# Patient Record
Sex: Male | Born: 1995 | Race: White | Hispanic: No | Marital: Single | Smoking: Current every day smoker
Health system: Southern US, Community
[De-identification: ages and names within clinical notes are randomized; demographics above are authoritative.]

## PROBLEM LIST (undated history)

## (undated) DIAGNOSIS — J45909 Unspecified asthma, uncomplicated: Secondary | ICD-10-CM

## (undated) DIAGNOSIS — A6002 Herpesviral infection of other male genital organs: Secondary | ICD-10-CM

## (undated) HISTORY — PX: FRACTURE SURGERY: SHX138

## (undated) HISTORY — PX: APPENDECTOMY: SHX54

---
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
## 2013-03-02 ENCOUNTER — Emergency Department (HOSPITAL_COMMUNITY)
Admission: EM | Admit: 2013-03-02 | Discharge: 2013-03-02 | Disposition: A | Discharge status: Home / Self Care | Payer: Medicaid Other | Attending: Emergency Medicine | Admitting: Emergency Medicine

## 2013-03-02 ENCOUNTER — Encounter (HOSPITAL_COMMUNITY): Payer: Self-pay | Admitting: *Deleted

## 2013-03-02 DIAGNOSIS — R05 Cough: Secondary | ICD-10-CM

## 2013-03-02 DIAGNOSIS — F17200 Nicotine dependence, unspecified, uncomplicated: Secondary | ICD-10-CM | POA: Insufficient documentation

## 2013-03-02 DIAGNOSIS — R059 Cough, unspecified: Secondary | ICD-10-CM | POA: Insufficient documentation

## 2013-03-02 DIAGNOSIS — R0602 Shortness of breath: Secondary | ICD-10-CM | POA: Insufficient documentation

## 2013-03-02 DIAGNOSIS — R079 Chest pain, unspecified: Secondary | ICD-10-CM | POA: Insufficient documentation

## 2013-03-02 DIAGNOSIS — J029 Acute pharyngitis, unspecified: Secondary | ICD-10-CM | POA: Insufficient documentation

## 2013-03-02 DIAGNOSIS — J3489 Other specified disorders of nose and nasal sinuses: Secondary | ICD-10-CM | POA: Insufficient documentation

## 2013-03-02 DIAGNOSIS — IMO0001 Reserved for inherently not codable concepts without codable children: Secondary | ICD-10-CM | POA: Insufficient documentation

## 2013-03-02 MED ORDER — ALBUTEROL SULFATE HFA 108 (90 BASE) MCG/ACT IN AERS: 2.0000 | INHALATION_SPRAY | Freq: Once | RESPIRATORY_TRACT | Status: DC

## 2013-03-02 MED ORDER — DOXYCYCLINE HYCLATE 100 MG PO CAPS: 100.0000 mg | ORAL_CAPSULE | Freq: Two times a day (BID) | ORAL | Status: DC

## 2013-03-02 NOTE — ED Provider Notes (Signed)
History  This chart was scribed for Joya Gaskins, MD by Bennett Scrape, ED Scribe. This patient was seen in room [facility] and the patient's care was started at 9:31 AM.  CSN: [id number]  Arrival date & time 03/02/13  0927   First MD Initiated Contact with Patient 03/02/13 [phone]      Chief Complaint  Patient presents with  . Cough     Patient is a 17 y.o. male presenting with cough. The history is provided by the patient. No language interpreter was used.  Cough Cough characteristics:  Productive Onset quality:  Gradual Duration:  2 days Timing:  Constant Progression:  Worsening Chronicity:  New Smoker: yes   Context: upper respiratory infection   Relieved by:  Nothing Worsened by:  Nothing tried Ineffective treatments:  None tried Associated symptoms: chest pain, myalgias, shortness of breath, sinus congestion and sore throat   Associated symptoms: no fever     HPI Comments: Donald Marshall is a 17 y.o. male who presents to the Emergency Department complaining of 2 days of gradual onset, gradually worsening, constant productive cough with associated sore throat, nasal congestion, SOB and rib pain described as soreness from coughing. This morning he had one episode of hemoptysis described as a hand full of bloody sputum. He denies any further episodes of hemoptysis  He denies similarities with prior illnesses. He denies epistaxis, fever, abdominal pain and leg swelling as associated symptoms. He denies having a h/o cardiac or pulmonary conditions. He denies any prior diagnoses of PE or DVT. Pt does not have a h/o chronic medical conditions.  PMH - none  Past Surgical History  Procedure Laterality Date  . Appendectomy      No family history on file.  History  Substance Use Topics  . Smoking status: Current Every Day Smoker -- 0.50 packs/day    Types: Cigarettes  . Smokeless tobacco: Not on file  . Alcohol Use: No      Review of Systems  Constitutional:  Negative for fever.  HENT: Positive for sore throat.   Respiratory: Positive for cough and shortness of breath.   Cardiovascular: Positive for chest pain. Negative for palpitations.  Musculoskeletal: Positive for myalgias.    Allergies  Sulfonamide derivatives  Home Medications   Current Outpatient Rx  Name  Route  Sig  Dispense  Refill  . ibuprofen (ADVIL,MOTRIN) 200 MG tablet   Oral   Take 400 mg by mouth every 6 (six) hours as needed. Pain           Triage Vitals: BP 119/71  Pulse 104  Temp(Src) 97.6 F (36.4 C) (Oral)  Resp 20  SpO2 100%  Physical Exam  Nursing note and vitals reviewed.  CONSTITUTIONAL: Well developed/well nourished HEAD: Normocephalic/atraumatic EYES: EOMI/PERRL ENMT: Mucous membranes moist, nasal congestion No blood noted in mouth or nares NECK: supple no meningeal signs SPINE:entire spine nontender CV: S1/S2 noted, no murmurs/rubs/gallops noted LUNGS: scattered wheezes, no apparent distress. He has congested cough ABDOMEN: soft, nontender, no rebound or guarding GU:no cva tenderness NEURO: Pt is awake/alert, moves all extremitiesx4 EXTREMITIES: pulses normal, full ROM, no calf tenderness SKIN: warm, color normal PSYCH: no abnormalities of mood noted  ED Course  Procedures   DIAGNOSTIC STUDIES: Oxygen Saturation is 100% on room air, normal by my interpretation.    COORDINATION OF CARE: 9:41 AM-Observed pt ambulate around the ED. No hypoxia was noted. Advised pt that he needs to stop smoking. Discussed treatment plan which includes inhaler and antibiotic  with pt at bedside and pt agreed to plan.   I personally ambulated patient in the ED.  He had mild tachypnea, but no distress, and no hypoxia (pulse ox 100%) Given small amt of hemoptysis I did order antibiotic Given he has cough/congestion and he is a smoker, suspect possible bronchitis.  Doubt PE or other acute cardiopulmonary process.  He left before medications given.  I told him to  stop smoking   MDM  Nursing notes including past medical history and social history reviewed and considered in documentation    I personally performed the services described in this documentation, which was scribed in my presence. The recorded information has been reviewed and is accurate.          Joya Gaskins, MD 03/02/13 [phone]

## 2013-03-02 NOTE — ED Notes (Signed)
Patient left refusing medication and prescription, did not sign.

## 2013-03-02 NOTE — ED Notes (Signed)
Pt c/o coughing that started two days ago, this am when pt coughed he noticed that the sputum was mixed with dark blood,

## 2013-03-11 ENCOUNTER — Emergency Department (HOSPITAL_COMMUNITY): Payer: Medicaid Other

## 2013-03-11 ENCOUNTER — Emergency Department (HOSPITAL_COMMUNITY)
Admission: EM | Admit: 2013-03-11 | Discharge: 2013-03-11 | Disposition: A | Discharge status: Home / Self Care | Payer: Medicaid Other | Attending: Emergency Medicine | Admitting: Emergency Medicine

## 2013-03-11 ENCOUNTER — Encounter (HOSPITAL_COMMUNITY): Payer: Self-pay | Admitting: Emergency Medicine

## 2013-03-11 DIAGNOSIS — J029 Acute pharyngitis, unspecified: Secondary | ICD-10-CM | POA: Insufficient documentation

## 2013-03-11 DIAGNOSIS — J209 Acute bronchitis, unspecified: Secondary | ICD-10-CM

## 2013-03-11 DIAGNOSIS — J3489 Other specified disorders of nose and nasal sinuses: Secondary | ICD-10-CM | POA: Insufficient documentation

## 2013-03-11 DIAGNOSIS — J40 Bronchitis, not specified as acute or chronic: Secondary | ICD-10-CM | POA: Insufficient documentation

## 2013-03-11 DIAGNOSIS — IMO0002 Reserved for concepts with insufficient information to code with codable children: Secondary | ICD-10-CM | POA: Insufficient documentation

## 2013-03-11 DIAGNOSIS — F17200 Nicotine dependence, unspecified, uncomplicated: Secondary | ICD-10-CM | POA: Insufficient documentation

## 2013-03-11 MED ORDER — BENZONATATE 100 MG PO CAPS: 100.0000 mg | ORAL_CAPSULE | Freq: Three times a day (TID) | ORAL | Status: DC | PRN

## 2013-03-11 MED ORDER — ALBUTEROL SULFATE HFA 108 (90 BASE) MCG/ACT IN AERS
2.0000 | INHALATION_SPRAY | Freq: Once | RESPIRATORY_TRACT | Status: AC
  Administered 2013-03-11: 2 via RESPIRATORY_TRACT
  Filled 2013-03-11: qty 6.7

## 2013-03-11 MED ORDER — BENZONATATE 100 MG PO CAPS
200.0000 mg | ORAL_CAPSULE | Freq: Once | ORAL | Status: AC
  Administered 2013-03-11: 200 mg via ORAL
  Filled 2013-03-11: qty 2

## 2013-03-11 MED ORDER — PREDNISONE 50 MG PO TABS
60.0000 mg | ORAL_TABLET | Freq: Once | ORAL | Status: AC
  Administered 2013-03-11: 60 mg via ORAL
  Filled 2013-03-11: qty 1

## 2013-03-11 MED ORDER — PREDNISONE 10 MG PO TABS: ORAL_TABLET | ORAL | Status: DC

## 2013-03-11 NOTE — ED Notes (Signed)
Pt c/o chest congestion and cough x 2 weeks.

## 2013-03-12 NOTE — ED Provider Notes (Signed)
Medical screening examination/treatment/procedure(s) were performed by non-physician practitioner and as supervising physician I was immediately available for consultation/collaboration.  Fremon Zacharia R. Mashawn Brazil, MD 03/12/13 2325 

## 2013-03-12 NOTE — ED Provider Notes (Signed)
History     CSN: [id number]  Arrival date & time 03/11/13  2008   First MD Initiated Contact with Patient 03/11/13 2048      Chief Complaint  Patient presents with  . Cough  . Nasal Congestion    (Consider location/radiation/quality/duration/timing/severity/associated sxs/prior treatment) HPI Comments: Donald Marshall is a 17 y.o. Male presenting with a 2 week history of chest congestion,  Describing frequent coughing which is productive of white to yellow sputum along with wheezing which is worsened when he first wakes in the morning,  States he has frequent productive coughing "til about noon"  Making it hard for him to do his job.  He has had no fevers or chills but does report increased shortness of breath mostly during the morning hours.  He does have occasional wheezing.  When his symptoms started he had nasal congestion and sore throat which has improved.  He describes bilateral chest soreness from coughing.  He denies hemoptysis this week but was seen here one week ago for the same symptoms at which time he had experienced one episode of this.  He denies history of cardiac or lung problems,  Denies history of asthma.  He is a 3/4 ppd smoker.  He has tried mucinex without relief.     The history is provided by the patient.    History reviewed. No pertinent past medical history.  Past Surgical History  Procedure Laterality Date  . Appendectomy      History reviewed. No pertinent family history.  History  Substance Use Topics  . Smoking status: Current Every Day Smoker -- 0.50 packs/day    Types: Cigarettes  . Smokeless tobacco: Not on file  . Alcohol Use: No      Review of Systems  Constitutional: Negative for fever.  HENT: Negative for congestion, sore throat and neck pain.   Eyes: Negative.   Respiratory: Negative for chest tightness and shortness of breath.   Cardiovascular: Negative for chest pain.  Gastrointestinal: Negative for nausea and abdominal pain.   Genitourinary: Negative.   Musculoskeletal: Negative for joint swelling and arthralgias.  Skin: Negative.  Negative for rash and wound.  Neurological: Negative for dizziness, weakness, light-headedness, numbness and headaches.  Psychiatric/Behavioral: Negative.     Allergies  Sulfonamide derivatives  Home Medications   Current Outpatient Rx  Name  Route  Sig  Dispense  Refill  . ibuprofen (ADVIL,MOTRIN) 200 MG tablet   Oral   Take 400 mg by mouth every 6 (six) hours as needed. Pain         . benzonatate (TESSALON) 100 MG capsule   Oral   Take 1 capsule (100 mg total) by mouth 3 (three) times daily as needed for cough.   21 capsule   0   . predniSONE (DELTASONE) 10 MG tablet      6, 5, 4, 3, 2 then 1 tablet by mouth daily for 6 days total.   21 tablet   0     BP 119/70  Pulse 95  Temp(Src) 98.4 F (36.9 C) (Oral)  Resp 20  Ht 5\' 9"  (1.753 m)  Wt 161 lb 1 oz (73.057 kg)  BMI 23.77 kg/m2  SpO2 98%  Physical Exam  Nursing note and vitals reviewed. Constitutional: He appears well-developed and well-nourished.  HENT:  Head: Normocephalic and atraumatic.  Right Ear: Tympanic membrane normal.  Left Ear: Tympanic membrane normal.  Nose: No mucosal edema or rhinorrhea.  Mouth/Throat: Uvula is midline, oropharynx is clear and  moist and mucous membranes are normal.  Eyes: Conjunctivae are normal.  Neck: Normal range of motion.  Cardiovascular: Normal rate, regular rhythm, normal heart sounds and intact distal pulses.   Pulmonary/Chest: Effort normal. No respiratory distress. He has wheezes in the left middle field and the left lower field. He has no rales.  Coarse breath sounds throughout all lung fields.  Abdominal: Soft. Bowel sounds are normal. He exhibits no distension. There is no tenderness.  Musculoskeletal: Normal range of motion. He exhibits no edema and no tenderness.  Neurological: He is alert.  Skin: Skin is warm and dry.  Psychiatric: He has a normal  mood and affect.    ED Course  Procedures (including critical care time)  Labs Reviewed - No data to display Dg Chest 2 View  03/11/2013   *RADIOLOGY REPORT*  Clinical Data: Cough and congestion.  CHEST - 2 VIEW  Comparison: Chest x-ray 06/16/2008.  Findings: Lung volumes are normal.  No consolidative airspace disease.  No pleural effusions.  No pneumothorax.  No pulmonary nodule or mass noted.  Pulmonary vasculature and the cardiomediastinal silhouette are within normal limits.  IMPRESSION: 1. No radiographic evidence of acute cardiopulmonary disease.   Original Report Authenticated By: Trudie Reed, M.D.     1. Bronchitis with bronchospasm       MDM  Patients labs and/or radiological studies were viewed and considered during the medical decision making and disposition process.  Pt was given albuterol mdi 2 puffs in ed, tessalon perles and prednisone 60 mg po.  Wheezing resolved at time of dc,  Cough improved per pt.  He was prescribed tessalon, prednisone taper.  Given albuterol mdi - encouraged 2 puffs q 4 hours prn cough or wheeze.  Discussed his need to stop smoking.  Pt understands.         Burgess Amor, PA-C 03/12/13 1402

## 2013-03-21 ENCOUNTER — Emergency Department (HOSPITAL_COMMUNITY): Payer: Medicaid Other

## 2013-03-21 ENCOUNTER — Emergency Department (HOSPITAL_COMMUNITY)
Admission: EM | Admit: 2013-03-21 | Discharge: 2013-03-21 | Disposition: A | Discharge status: Home / Self Care | Payer: Medicaid Other | Attending: Emergency Medicine | Admitting: Emergency Medicine

## 2013-03-21 ENCOUNTER — Encounter (HOSPITAL_COMMUNITY): Payer: Self-pay | Admitting: *Deleted

## 2013-03-21 ENCOUNTER — Emergency Department (HOSPITAL_COMMUNITY)
Admission: EM | Admit: 2013-03-21 | Discharge: 2013-03-21 | Disposition: A | Discharge status: Home / Self Care | Payer: No Typology Code available for payment source

## 2013-03-21 DIAGNOSIS — Y92410 Unspecified street and highway as the place of occurrence of the external cause: Secondary | ICD-10-CM | POA: Insufficient documentation

## 2013-03-21 DIAGNOSIS — S60212A Contusion of left wrist, initial encounter: Secondary | ICD-10-CM

## 2013-03-21 DIAGNOSIS — S8001XA Contusion of right knee, initial encounter: Secondary | ICD-10-CM

## 2013-03-21 DIAGNOSIS — S61511A Laceration without foreign body of right wrist, initial encounter: Secondary | ICD-10-CM

## 2013-03-21 DIAGNOSIS — S60219A Contusion of unspecified wrist, initial encounter: Secondary | ICD-10-CM | POA: Insufficient documentation

## 2013-03-21 DIAGNOSIS — Y9389 Activity, other specified: Secondary | ICD-10-CM | POA: Insufficient documentation

## 2013-03-21 DIAGNOSIS — F17200 Nicotine dependence, unspecified, uncomplicated: Secondary | ICD-10-CM | POA: Insufficient documentation

## 2013-03-21 DIAGNOSIS — S61509A Unspecified open wound of unspecified wrist, initial encounter: Secondary | ICD-10-CM | POA: Insufficient documentation

## 2013-03-21 DIAGNOSIS — S8000XA Contusion of unspecified knee, initial encounter: Secondary | ICD-10-CM | POA: Insufficient documentation

## 2013-03-21 MED ORDER — OXYCODONE-ACETAMINOPHEN 5-325 MG PO TABS: 1.0000 | ORAL_TABLET | ORAL | Status: DC | PRN

## 2013-03-21 MED ORDER — LIDOCAINE HCL (PF) 1 % IJ SOLN
10.0000 mL | Freq: Once | INTRAMUSCULAR | Status: AC
  Administered 2013-03-21: 10 mL via INTRADERMAL
  Filled 2013-03-21: qty 5

## 2013-03-21 MED ORDER — OXYCODONE-ACETAMINOPHEN 5-325 MG PO TABS
1.0000 | ORAL_TABLET | Freq: Once | ORAL | Status: AC
Start: 2013-03-21 — End: 2013-03-21
  Administered 2013-03-21: 1 via ORAL
  Filled 2013-03-21: qty 1

## 2013-03-21 MED ORDER — DOXYCYCLINE HYCLATE 100 MG PO CAPS: 100.0000 mg | ORAL_CAPSULE | Freq: Two times a day (BID) | ORAL | Status: DC

## 2013-03-21 MED ORDER — BACITRACIN ZINC 500 UNIT/GM EX OINT
1.0000 "application " | TOPICAL_OINTMENT | Freq: Two times a day (BID) | CUTANEOUS | Status: DC
  Administered 2013-03-21: 1 via TOPICAL
  Filled 2013-03-21: qty 2.7
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
## 2013-03-21 NOTE — ED Notes (Addendum)
After 2 more argumentative phone calls, pt and I reached an agreement that no further phone calls/visitors until discharge so that we may attend to his medical needs. Pt and friend Jeralyn Bennett agrees. Donald Marshall to handle waiting room entourage. Registration notified of same
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
## 2013-03-21 NOTE — ED Notes (Signed)
Fully alert, easily ambulatory, much calmer than upon admission.

## 2013-03-22 NOTE — ED Provider Notes (Signed)
Medical screening examination/treatment/procedure(s) were performed by non-physician practitioner and as supervising physician I was immediately available for consultation/collaboration.   Jimya Ciani L Nobie Alleyne, MD 03/22/13 0052 

## 2013-03-27 ENCOUNTER — Emergency Department (HOSPITAL_COMMUNITY)
Admission: EM | Admit: 2013-03-27 | Discharge: 2013-03-27 | Disposition: A | Discharge status: Home / Self Care | Payer: Medicaid Other | Attending: Emergency Medicine | Admitting: Emergency Medicine

## 2013-03-27 ENCOUNTER — Encounter (HOSPITAL_COMMUNITY): Payer: Self-pay | Admitting: Emergency Medicine

## 2013-03-27 DIAGNOSIS — F17200 Nicotine dependence, unspecified, uncomplicated: Secondary | ICD-10-CM | POA: Insufficient documentation

## 2013-03-27 DIAGNOSIS — M25569 Pain in unspecified knee: Secondary | ICD-10-CM | POA: Insufficient documentation

## 2013-03-27 DIAGNOSIS — M25539 Pain in unspecified wrist: Secondary | ICD-10-CM | POA: Insufficient documentation

## 2013-03-27 DIAGNOSIS — M25531 Pain in right wrist: Secondary | ICD-10-CM

## 2013-03-27 DIAGNOSIS — M549 Dorsalgia, unspecified: Secondary | ICD-10-CM | POA: Insufficient documentation

## 2013-03-27 DIAGNOSIS — M25561 Pain in right knee: Secondary | ICD-10-CM

## 2013-03-27 MED ORDER — TRAMADOL HCL 50 MG PO TABS
50.0000 mg | ORAL_TABLET | Freq: Once | ORAL | Status: AC
  Administered 2013-03-27: 50 mg via ORAL
  Filled 2013-03-27: qty 1

## 2013-03-27 NOTE — ED Notes (Signed)
Pt wants an xray of his back, EDP notified.
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
## 2013-04-13 ENCOUNTER — Emergency Department (HOSPITAL_COMMUNITY)
Admission: EM | Admit: 2013-04-13 | Discharge: 2013-04-13 | Disposition: A | Discharge status: Home / Self Care | Payer: Medicaid Other | Attending: Emergency Medicine | Admitting: Emergency Medicine

## 2013-04-13 ENCOUNTER — Encounter (HOSPITAL_COMMUNITY): Payer: Self-pay | Admitting: *Deleted

## 2013-04-13 DIAGNOSIS — B0221 Postherpetic geniculate ganglionitis: Secondary | ICD-10-CM | POA: Insufficient documentation

## 2013-04-13 DIAGNOSIS — F17200 Nicotine dependence, unspecified, uncomplicated: Secondary | ICD-10-CM | POA: Insufficient documentation

## 2013-04-13 DIAGNOSIS — A6002 Herpesviral infection of other male genital organs: Secondary | ICD-10-CM

## 2013-04-13 DIAGNOSIS — N489 Disorder of penis, unspecified: Secondary | ICD-10-CM | POA: Insufficient documentation

## 2013-04-13 MED ORDER — ACYCLOVIR 800 MG PO TABS
800.0000 mg | ORAL_TABLET | Freq: Once | ORAL | Status: AC
  Administered 2013-04-13: 800 mg via ORAL
  Filled 2013-04-13: qty 1

## 2013-04-13 MED ORDER — ACYCLOVIR 400 MG PO TABS: 800.0000 mg | ORAL_TABLET | Freq: Every day | ORAL | Status: DC

## 2013-04-13 MED ORDER — HYDROCODONE-ACETAMINOPHEN 5-325 MG PO TABS
1.0000 | ORAL_TABLET | Freq: Once | ORAL | Status: AC
  Administered 2013-04-13: 1 via ORAL
  Filled 2013-04-13: qty 1

## 2013-04-13 NOTE — ED Notes (Signed)
Pt states he had a blister on his penis that he popped a few days ago. Now it has come back & is bigger. Pt states pain w/ urination also.

## 2013-04-13 NOTE — ED Provider Notes (Signed)
History    CSN: [id number] Arrival date & time 04/13/13  7829  First MD Initiated Contact with Patient 04/13/13 0510     Chief Complaint  Patient presents with  . Abscess   (Consider location/radiation/quality/duration/timing/severity/associated sxs/prior Treatment) HPI HPI Comments: Donald Marshall is a 17 y.o. male who presents to the Emergency Department complaining of sores to his penis. He has had unprotected sex recently and for the last three days he has developed a series of blisters on his penis. They are extremely sore. He has taken no medicines.     History reviewed. No pertinent past medical history. Past Surgical History  Procedure Laterality Date  . Appendectomy     No family history on file. History  Substance Use Topics  . Smoking status: Current Every Day Smoker -- 0.50 packs/day    Types: Cigarettes  . Smokeless tobacco: Not on file  . Alcohol Use: No    Review of Systems  Constitutional: Negative for fever.       10 Systems reviewed and are negative for acute change except as noted in the HPI.  HENT: Negative for congestion.   Eyes: Negative for discharge and redness.  Respiratory: Negative for cough and shortness of breath.   Cardiovascular: Negative for chest pain.  Gastrointestinal: Negative for vomiting and abdominal pain.  Genitourinary: Positive for penile pain.  Musculoskeletal: Negative for back pain.  Skin: Negative for rash.  Neurological: Negative for syncope, numbness and headaches.  Psychiatric/Behavioral:       No behavior change.    Allergies  Sulfonamide derivatives  Home Medications   Current Outpatient Rx  Name  Route  Sig  Dispense  Refill  . ibuprofen (ADVIL,MOTRIN) 200 MG tablet   Oral   Take 400 mg by mouth every 6 (six) hours as needed. Pain         . benzonatate (TESSALON) 100 MG capsule   Oral   Take 1 capsule (100 mg total) by mouth 3 (three) times daily as needed for cough.   21 capsule   0   .  doxycycline (VIBRAMYCIN) 100 MG capsule   Oral   Take 1 capsule (100 mg total) by mouth 2 (two) times daily.   20 capsule   0   . oxyCODONE-acetaminophen (PERCOCET/ROXICET) 5-325 MG per tablet   Oral   Take 1 tablet by mouth every 4 (four) hours as needed for pain.   12 tablet   0   . predniSONE (DELTASONE) 10 MG tablet      6, 5, 4, 3, 2 then 1 tablet by mouth daily for 6 days total.   21 tablet   0    BP 122/73  Pulse 81  Temp(Src) 98.2 F (36.8 C) (Oral)  Resp 16  Ht 5\' 9"  (1.753 m)  Wt 160 lb (72.576 kg)  BMI 23.62 kg/m2  SpO2 100% Physical Exam  Nursing note and vitals reviewed. Constitutional: He appears well-developed and well-nourished.  Awake, alert, nontoxic appearance.  HENT:  Head: Normocephalic and atraumatic.  Eyes: EOM are normal. Pupils are equal, round, and reactive to light.  Neck: Neck supple.  Cardiovascular: Normal rate and intact distal pulses.   Pulmonary/Chest: Effort normal and breath sounds normal. He exhibits no tenderness.  Abdominal: Soft. Bowel sounds are normal. There is no tenderness. There is no rebound.  Genitourinary:  Genital exam performed with pt permission and male ED Tech chaparone present during exam.  Pt examined laying and standing.  No perineal erythema.  Penile lesions to mid shaft and to the base of the penis. Red raised blisters on an erythematous base.  No scrotal erythema, edema or tenderness to palp.  Normal testicular lie.  No testicular tenderness to palp.    Musculoskeletal: He exhibits no tenderness.  Baseline ROM, no obvious new focal weakness.  Neurological:  Mental status and motor strength appears baseline for patient and situation.  Skin: No rash noted.  Psychiatric: He has a normal mood and affect.    ED Course  Procedures (including critical care time)    MDM  Patient presents with penile lesions to the mid shaft and base of the penis. Suspicious for herpes. Will treat for herpes. Provided counseling  on herpes, unprotected sex and transmission of disease. Pt stable in ED with no significant deterioration in condition.The patient appears reasonably screened and/or stabilized for discharge and I doubt any other medical condition or other [facility] requiring further screening, evaluation, or treatment in the ED at this time prior to discharge.  MDM Reviewed: nursing note and vitals     Nicoletta Dress. Colon Branch, MD 04/13/13 [phone]

## 2013-04-13 NOTE — ED Notes (Signed)
Pt alert & oriented x4, stable gait. Patient given discharge instructions, paperwork & prescription(s). Patient  instructed to stop at the registration desk to finish any additional paperwork. Patient verbalized understanding. Pt left department w/ no further questions. 

## 2013-04-23 ENCOUNTER — Encounter (HOSPITAL_COMMUNITY): Payer: Self-pay | Admitting: *Deleted

## 2013-04-23 ENCOUNTER — Emergency Department (HOSPITAL_COMMUNITY)
Admission: EM | Admit: 2013-04-23 | Discharge: 2013-04-23 | Disposition: A | Discharge status: Home / Self Care | Payer: Medicaid Other | Attending: Emergency Medicine | Admitting: Emergency Medicine

## 2013-04-23 DIAGNOSIS — Z8619 Personal history of other infectious and parasitic diseases: Secondary | ICD-10-CM | POA: Insufficient documentation

## 2013-04-23 DIAGNOSIS — IMO0002 Reserved for concepts with insufficient information to code with codable children: Secondary | ICD-10-CM | POA: Insufficient documentation

## 2013-04-23 DIAGNOSIS — N4829 Other inflammatory disorders of penis: Secondary | ICD-10-CM | POA: Insufficient documentation

## 2013-04-23 DIAGNOSIS — Z79899 Other long term (current) drug therapy: Secondary | ICD-10-CM | POA: Insufficient documentation

## 2013-04-23 DIAGNOSIS — F17200 Nicotine dependence, unspecified, uncomplicated: Secondary | ICD-10-CM | POA: Insufficient documentation

## 2013-04-23 DIAGNOSIS — L0291 Cutaneous abscess, unspecified: Secondary | ICD-10-CM

## 2013-04-23 HISTORY — DX: Herpesviral infection of other male genital organs: A60.02

## 2013-04-23 MED ORDER — HYDROCODONE-ACETAMINOPHEN 5-325 MG PO TABS: 2.0000 | ORAL_TABLET | ORAL | Status: DC | PRN

## 2013-04-23 MED ORDER — SULFAMETHOXAZOLE-TRIMETHOPRIM 800-160 MG PO TABS: 1.0000 | ORAL_TABLET | Freq: Two times a day (BID) | ORAL | Status: DC

## 2013-04-23 NOTE — ED Provider Notes (Signed)
   History    CSN: [id number] Arrival date & time 04/23/13  4098  First MD Initiated Contact with Patient 04/23/13 [phone]     Chief Complaint  Patient presents with  . Groin Pain   (Consider location/radiation/quality/duration/timing/severity/associated sxs/prior Treatment) HPI Comments: Patient returns for evaluation of painful lesion on the penis. Patient was previously seen in herpes. Patient reports he has been taking this medication as well as ibuprofen Tylenol, but the area is worsening. There has not been any drainage from the area.  Patient is a 17 y.o. male presenting with groin pain.  Groin Pain   Past Medical History  Diagnosis Date  . Herpes genitalis in men    Past Surgical History  Procedure Laterality Date  . Appendectomy     No family history on file. History  Substance Use Topics  . Smoking status: Current Every Day Smoker -- 0.50 packs/day    Types: Cigarettes  . Smokeless tobacco: Not on file  . Alcohol Use: No    Review of Systems  Skin:       Lesion on penis    Allergies  Sulfonamide derivatives  Home Medications   Current Outpatient Rx  Name  Route  Sig  Dispense  Refill  . acyclovir (ZOVIRAX) 400 MG tablet   Oral   Take 2 tablets (800 mg total) by mouth 5 (five) times daily.   100 tablet   0   . ibuprofen (ADVIL,MOTRIN) 200 MG tablet   Oral   Take 400 mg by mouth every 6 (six) hours as needed. Pain         . benzonatate (TESSALON) 100 MG capsule   Oral   Take 1 capsule (100 mg total) by mouth 3 (three) times daily as needed for cough.   21 capsule   0   . doxycycline (VIBRAMYCIN) 100 MG capsule   Oral   Take 1 capsule (100 mg total) by mouth 2 (two) times daily.   20 capsule   0   . oxyCODONE-acetaminophen (PERCOCET/ROXICET) 5-325 MG per tablet   Oral   Take 1 tablet by mouth every 4 (four) hours as needed for pain.   12 tablet   0   . predniSONE (DELTASONE) 10 MG tablet      6, 5, 4, 3, 2 then 1 tablet by mouth daily  for 6 days total.   21 tablet   0    BP 137/66  Pulse 87  Temp(Src) 98.9 F (37.2 C) (Oral)  Resp 20  Ht 5\' 8"  (1.727 m)  Wt 160 lb (72.576 kg)  BMI 24.33 kg/m2  SpO2 100% Physical Exam  Genitourinary:     Skin:       ED Course  Procedures (including critical care time) Labs Reviewed - No data to display No results found.  Diagnosis: Skin abscess  MDM  Patient presents to the ER for evaluation of persistent pain and a lesion that was previously diagnosed as herpes. Evaluation reveals a tender, raised lesion that is non-ulcerated. This does not seem consistent with herpes. Not consistent with syphilitic chancre. Does not appear to be STD, more consistent with skin abscess. Will treat with Bactrim and pain medication.  Gilda Crease, MD 04/23/13 [phone]

## 2013-04-23 NOTE — ED Notes (Signed)
Pt reports being seen in ER and given medication for herpes. States he has been taking medicine & hurts more & has gotten larger.

## 2013-05-22 ENCOUNTER — Emergency Department (HOSPITAL_COMMUNITY)
Admission: EM | Admit: 2013-05-22 | Discharge: 2013-05-22 | Disposition: A | Discharge status: Home / Self Care | Payer: Medicaid Other | Attending: Emergency Medicine | Admitting: Emergency Medicine

## 2013-05-22 ENCOUNTER — Encounter (HOSPITAL_COMMUNITY): Payer: Self-pay | Admitting: Emergency Medicine

## 2013-05-22 DIAGNOSIS — M79609 Pain in unspecified limb: Secondary | ICD-10-CM | POA: Insufficient documentation

## 2013-05-22 DIAGNOSIS — M79641 Pain in right hand: Secondary | ICD-10-CM

## 2013-05-22 DIAGNOSIS — Z79899 Other long term (current) drug therapy: Secondary | ICD-10-CM | POA: Insufficient documentation

## 2013-05-22 DIAGNOSIS — Z8619 Personal history of other infectious and parasitic diseases: Secondary | ICD-10-CM | POA: Insufficient documentation

## 2013-05-22 DIAGNOSIS — F17200 Nicotine dependence, unspecified, uncomplicated: Secondary | ICD-10-CM | POA: Insufficient documentation

## 2013-05-22 DIAGNOSIS — J45909 Unspecified asthma, uncomplicated: Secondary | ICD-10-CM | POA: Insufficient documentation

## 2013-05-22 DIAGNOSIS — G8918 Other acute postprocedural pain: Secondary | ICD-10-CM | POA: Insufficient documentation

## 2013-05-22 HISTORY — DX: Unspecified asthma, uncomplicated: J45.909

## 2013-05-22 MED ORDER — OXYCODONE-ACETAMINOPHEN 5-325 MG PO TABS
1.0000 | ORAL_TABLET | Freq: Once | ORAL | Status: AC
  Administered 2013-05-22: 1 via ORAL
  Filled 2013-05-22: qty 1

## 2013-05-22 MED ORDER — OXYCODONE-ACETAMINOPHEN 5-325 MG PO TABS: 1.0000 | ORAL_TABLET | Freq: Four times a day (QID) | ORAL | Status: DC | PRN

## 2013-05-22 NOTE — ED Notes (Signed)
Pt c/o pain and edema to R hand. Pt had a crush injury that required surgery with mult internal fixation devices. Dr. Case in [place] is the Careers adviser. Pt states the pain medicine he was given is not working, has been unable to sleep.

## 2013-05-25 NOTE — ED Provider Notes (Signed)
CSN: [id number]     Arrival date & time 05/22/13  2002 History     First MD Initiated Contact with Patient 05/22/13 2017     Chief Complaint  Patient presents with  . Hand Pain   (Consider location/radiation/quality/duration/timing/severity/associated sxs/prior Treatment) HPI Comments: Donald Marshall is a 17 y.o. male who presents to the Emergency Department complaining of persistent post op pain to the right hand.  States that he had surgery to the right hand earlier this week and was given norco for pain.  States the medication is not controlling the pain.  States the pain is keeping him from sleeping.  Surgeon was Dr. Case in [place].  Unable to see Dr. Case today.  He denies numbness to his fingers, swelling or discoloration.  Has a short arm cast in place.    Patient is a 17 y.o. male presenting with hand pain. The history is provided by the patient and a parent.  Hand Pain This is a new problem. The current episode started in the past 7 days. The problem occurs constantly. The problem has been unchanged. Associated symptoms include arthralgias. Pertinent negatives include no chills, fever, headaches, joint swelling, myalgias, nausea, neck pain, numbness, rash, vomiting or weakness. The symptoms are aggravated by bending. He has tried position changes and oral narcotics for the symptoms. The treatment provided no relief.    Past Medical History  Diagnosis Date  . Herpes genitalis in men   . Asthma    Past Surgical History  Procedure Laterality Date  . Appendectomy    . Fracture surgery     Family History  Problem Relation Age of Onset  . Kidney failure Mother   . COPD Mother   . Hypertension Mother   . Cancer Other   . Asthma Other   . Heart failure Other    History  Substance Use Topics  . Smoking status: Current Every Day Smoker -- 0.50 packs/day    Types: Cigarettes  . Smokeless tobacco: Not on file  . Alcohol Use: No    Review of Systems  Constitutional: Negative  for fever and chills.  HENT: Negative for neck pain.   Gastrointestinal: Negative for nausea and vomiting.  Genitourinary: Negative for dysuria and difficulty urinating.  Musculoskeletal: Positive for arthralgias. Negative for myalgias and joint swelling.  Skin: Negative for color change, rash and wound.  Neurological: Negative for weakness, numbness and headaches.  All other systems reviewed and are negative.    Allergies  Sulfonamide derivatives  Home Medications   Current Outpatient Rx  Name  Route  Sig  Dispense  Refill  . acyclovir (ZOVIRAX) 400 MG tablet   Oral   Take 2 tablets (800 mg total) by mouth 5 (five) times daily.   100 tablet   0   . benzonatate (TESSALON) 100 MG capsule   Oral   Take 1 capsule (100 mg total) by mouth 3 (three) times daily as needed for cough.   21 capsule   0   . doxycycline (VIBRAMYCIN) 100 MG capsule   Oral   Take 1 capsule (100 mg total) by mouth 2 (two) times daily.   20 capsule   0   . HYDROcodone-acetaminophen (NORCO/VICODIN) 5-325 MG per tablet   Oral   Take 2 tablets by mouth every 4 (four) hours as needed for pain.   15 tablet   0   . ibuprofen (ADVIL,MOTRIN) 200 MG tablet   Oral   Take 400 mg by mouth every  6 (six) hours as needed. Pain         . oxyCODONE-acetaminophen (PERCOCET/ROXICET) 5-325 MG per tablet   Oral   Take 1 tablet by mouth every 4 (four) hours as needed for pain.   12 tablet   0   . oxyCODONE-acetaminophen (PERCOCET/ROXICET) 5-325 MG per tablet   Oral   Take 1 tablet by mouth every 6 (six) hours as needed for pain.   15 tablet   0   . predniSONE (DELTASONE) 10 MG tablet      6, 5, 4, 3, 2 then 1 tablet by mouth daily for 6 days total.   21 tablet   0   . sulfamethoxazole-trimethoprim (SEPTRA DS) 800-160 MG per tablet   Oral   Take 1 tablet by mouth 2 (two) times daily.   20 tablet   0    BP 146/72  Pulse 105  Temp(Src) 98.4 F (36.9 C) (Oral)  Resp 24  Ht 5\' 8"  (1.727 m)  Wt 160  lb (72.576 kg)  BMI 24.33 kg/m2  SpO2 100% Physical Exam  Nursing note and vitals reviewed. Constitutional: He is oriented to person, place, and time. He appears well-developed and well-nourished. No distress.  HENT:  Head: Normocephalic and atraumatic.  Cardiovascular: Normal rate, regular rhythm and normal heart sounds.   Pulmonary/Chest: Effort normal and breath sounds normal. No respiratory distress.  Musculoskeletal: He exhibits tenderness. He exhibits no edema.  Pt has a short arm cast in place.  Distal sensation is intact,  Able to move the fingers w/o difficulty.  CR< 2 sec. No discoloration or edema to the fingers.  No edema or pain proximal to the cast.  I am able to place two fingers into the distal and proximal ends of the cast w/o difficulty.    Neurological: He is alert and oriented to person, place, and time. He exhibits normal muscle tone. Coordination normal.  Skin: Skin is warm and dry.    ED Course   Procedures (including critical care time)  Labs Reviewed - No data to display No results found. 1. Hand pain, right   2. Post-op pain     MDM    Patient is well appearing.  No concerning sx's for vascular issue or infection.  Pt agrees to elevate the arm and I have advised him that he will need to contact Dr. Gayla Medicus office tomorrow.    He appears stable for discharge.  Donald Casanas L. Antha Niday, PA-C 05/25/13 1325

## 2013-05-26 NOTE — ED Provider Notes (Signed)
Medical screening examination/treatment/procedure(s) were performed by non-physician practitioner and as supervising physician I was immediately available for consultation/collaboration.  Juliet Rude. Rubin Payor, MD 05/26/13 1624

## 2013-06-11 ENCOUNTER — Telehealth: Payer: Self-pay | Admitting: Orthopedic Surgery

## 2013-06-11 ENCOUNTER — Encounter (HOSPITAL_COMMUNITY): Payer: Self-pay | Admitting: Emergency Medicine

## 2013-06-11 ENCOUNTER — Emergency Department (HOSPITAL_COMMUNITY)
Admission: EM | Admit: 2013-06-11 | Discharge: 2013-06-11 | Disposition: A | Discharge status: Home / Self Care | Payer: Medicaid Other | Attending: Emergency Medicine | Admitting: Emergency Medicine

## 2013-06-11 ENCOUNTER — Emergency Department (HOSPITAL_COMMUNITY): Payer: Medicaid Other

## 2013-06-11 DIAGNOSIS — S6291XS Unspecified fracture of right wrist and hand, sequela: Secondary | ICD-10-CM

## 2013-06-11 DIAGNOSIS — F17200 Nicotine dependence, unspecified, uncomplicated: Secondary | ICD-10-CM | POA: Insufficient documentation

## 2013-06-11 DIAGNOSIS — Z79899 Other long term (current) drug therapy: Secondary | ICD-10-CM | POA: Insufficient documentation

## 2013-06-11 DIAGNOSIS — M25549 Pain in joints of unspecified hand: Secondary | ICD-10-CM | POA: Insufficient documentation

## 2013-06-11 DIAGNOSIS — Z8619 Personal history of other infectious and parasitic diseases: Secondary | ICD-10-CM | POA: Insufficient documentation

## 2013-06-11 DIAGNOSIS — Z792 Long term (current) use of antibiotics: Secondary | ICD-10-CM | POA: Insufficient documentation

## 2013-06-11 DIAGNOSIS — J45909 Unspecified asthma, uncomplicated: Secondary | ICD-10-CM | POA: Insufficient documentation

## 2013-06-11 DIAGNOSIS — Z9889 Other specified postprocedural states: Secondary | ICD-10-CM | POA: Insufficient documentation

## 2013-06-11 MED ORDER — HYDROCODONE-ACETAMINOPHEN 5-325 MG PO TABS: 1.0000 | ORAL_TABLET | ORAL | Status: DC | PRN

## 2013-06-11 MED ORDER — IBUPROFEN 600 MG PO TABS: 600.0000 mg | ORAL_TABLET | Freq: Four times a day (QID) | ORAL | Status: DC | PRN

## 2013-06-11 NOTE — ED Notes (Signed)
Splint removed per [facility] NP

## 2013-06-11 NOTE — ED Provider Notes (Signed)
CSN: [id number]     Arrival date & time 06/11/13  4098 History   First MD Initiated Contact with Patient 06/11/13 0912     Chief Complaint  Patient presents with  . Hand Pain   (Consider location/radiation/quality/duration/timing/severity/associated sxs/prior Treatment) Patient is a 17 y.o. male presenting with hand pain. The history is provided by the patient.  Hand Pain This is a recurrent problem. Pertinent negatives include no chills, fever, nausea, neck pain or vomiting.   Donald Marshall is a 17 y.o. male who presents to the ED with right hand pain. He had an injury at work 05/13/13 and broke his hand. He went to [facility] to [facility] and dx with fracture x 2. He had follow up with Dr. Case and had surgery on his hand that required 2 plates and 12 screws. He returned for follow up and saw someone else in the office and was told he could take the splint off to shower. He fell in the shower and hit his hand and went back and saw Dr. Case and he told him one and maybe 2 screws were coming out and there was nothing he could do. Patient followed up here 8/10 and was given referral to Dr. Romeo Apple. Called Dr. Mort Sawyers office and was told he could not see him because he had started with another doctor. Patient has been unable to find anyone to see him. Patient here today with increased pain.  Past Medical History  Diagnosis Date  . Herpes genitalis in men   . Asthma    Past Surgical History  Procedure Laterality Date  . Appendectomy    . Fracture surgery     Family History  Problem Relation Age of Onset  . Kidney failure Mother   . COPD Mother   . Hypertension Mother   . Cancer Other   . Asthma Other   . Heart failure Other    History  Substance Use Topics  . Smoking status: Current Every Day Smoker -- 0.50 packs/day    Types: Cigarettes  . Smokeless tobacco: Not on file  . Alcohol Use: No    Review of Systems  Constitutional: Negative for fever and chills.  HENT:  Negative for neck pain.   Respiratory: Negative for shortness of breath.   Gastrointestinal: Negative for nausea and vomiting.  Musculoskeletal:       Right hand pain  Skin: Positive for wound.  Psychiatric/Behavioral: The patient is not nervous/anxious.     Allergies  Sulfonamide derivatives  Home Medications   Current Outpatient Rx  Name  Route  Sig  Dispense  Refill  . acyclovir (ZOVIRAX) 400 MG tablet   Oral   Take 2 tablets (800 mg total) by mouth 5 (five) times daily.   100 tablet   0   . benzonatate (TESSALON) 100 MG capsule   Oral   Take 1 capsule (100 mg total) by mouth 3 (three) times daily as needed for cough.   21 capsule   0   . doxycycline (VIBRAMYCIN) 100 MG capsule   Oral   Take 1 capsule (100 mg total) by mouth 2 (two) times daily.   20 capsule   0   . HYDROcodone-acetaminophen (NORCO/VICODIN) 5-325 MG per tablet   Oral   Take 2 tablets by mouth every 4 (four) hours as needed for pain.   15 tablet   0   . ibuprofen (ADVIL,MOTRIN) 200 MG tablet   Oral   Take 400 mg by mouth every  6 (six) hours as needed. Pain         . oxyCODONE-acetaminophen (PERCOCET/ROXICET) 5-325 MG per tablet   Oral   Take 1 tablet by mouth every 4 (four) hours as needed for pain.   12 tablet   0   . oxyCODONE-acetaminophen (PERCOCET/ROXICET) 5-325 MG per tablet   Oral   Take 1 tablet by mouth every 6 (six) hours as needed for pain.   15 tablet   0   . predniSONE (DELTASONE) 10 MG tablet      6, 5, 4, 3, 2 then 1 tablet by mouth daily for 6 days total.   21 tablet   0   . sulfamethoxazole-trimethoprim (SEPTRA DS) 800-160 MG per tablet   Oral   Take 1 tablet by mouth 2 (two) times daily.   20 tablet   0    BP 131/79  Pulse 88  Temp(Src) 97.5 F (36.4 C) (Oral)  Resp 18  Ht 5\' 8"  (1.727 m)  Wt 160 lb (72.576 kg)  BMI 24.33 kg/m2  SpO2 99% Physical Exam  Nursing note and vitals reviewed. Constitutional: He is oriented to person, place, and time. He  appears well-developed and well-nourished. No distress.  HENT:  Head: Normocephalic.  Eyes: EOM are normal.  Neck: Neck supple.  Cardiovascular: Normal rate.   Pulmonary/Chest: Effort normal.  Musculoskeletal:       Right hand: He exhibits decreased range of motion (due to pain), tenderness and swelling. He exhibits normal capillary refill.  Neurological: He is alert and oriented to person, place, and time. No cranial nerve deficit.  Skin: Skin is warm and dry.  Psychiatric: He has a normal mood and affect. His behavior is normal.    ED Course: splint removed and patient sent for x-rays.  Procedures  Dg Hand Complete Right  06/11/2013   CLINICAL DATA:  Right hand pain, injury. Recent internal fixation.  EXAM: RIGHT HAND - COMPLETE 3+ VIEW  COMPARISON:  03/21/2013  FINDINGS: There is posterior soft tissue swelling. Plate and screw fixation devices are noted in the 4th and 5th metacarpals. No hardware or bony complicating feature. Fracture through the 5th metacarpal is visualized. Cannot visualize the 4th metacarpal fracture these images.  IMPRESSION: Plate and screw fixation within the right 4th and 5th metacarpals.   Electronically Signed   By: Charlett Nose   On: 06/11/2013 10:18    MDM  17 y.o. male here for follow up of fx to right hand and request for referral to doctor in [place]. RN called to Dr. Mort Sawyers office and was told patient could follow up.  Office called back to say that because the patient had recent surgery with another doctor that Dr. Romeo Apple would not assume care. The patient will need to follow up with the surgeon that he has a follow up appointment with and get a referral to a hand surgeon if he wants another provider. Dr. Mort Sawyers office to notify the patient of Dr. Mort Sawyers decision. Patient stable for discharge without any immediate complications. Will treat pain.    Medication List    TAKE these medications       HYDROcodone-acetaminophen 5-325 MG per  tablet  Commonly known as:  NORCO/VICODIN  Take 1 tablet by mouth every 4 (four) hours as needed.     ibuprofen 600 MG tablet  Commonly known as:  ADVIL,MOTRIN  Take 1 tablet (600 mg total) by mouth every 6 (six) hours as needed for pain.  ASK your doctor about these medications       acyclovir 400 MG tablet  Commonly known as:  ZOVIRAX  Take 2 tablets (800 mg total) by mouth 5 (five) times daily.           [facility] Orlene Och, [place] 06/11/13 1626

## 2013-06-11 NOTE — ED Notes (Signed)
Pt reports having surgery on right hand 3 weeks ago. Pt c/o of increased pain in right hand x 10 days as result of fall.

## 2013-06-11 NOTE — Telephone Encounter (Signed)
Call received this morning approximately 10:40am, 06/11/13 from [facility], [facility]; states charge nurse needs to schedule an appointment with Dr Romeo Apple for patient who is about to be discharged from Emergency Department for hand problem; she said that patient had recent hand surgery at [facility], and that "he is not happy with the orthopedic surgeon who performed the surgery, and that he needs to be scheduled."  I explained that I was unable to directly schedule the patient without Dr. Mort Sawyers review and advice, as Dr. Mort Sawyers protocol is that he reviews medical records, including operative notes, films, reports, prior to any appointment scheduling, in the case of any patient requesting appointment for a medical problem for which orthopedic treatment has been provided by another orthopedic surgeon.  ** Immediately after this call had been received, our clinic site manager, Doneen Poisson, had relayed to me that she had received a call from this patient early this morning before 8:00a.m today, 06/11/13, and that he had asked about scheduling an appointment for his hand that he had surgery on 3 weeks ago, and he had 2 plates and 12 screws put into the hand by a surgeon in [place], and that he wanted to have another doctor check it.  Aurea Graff relayed to me that Dr. Romeo Apple may not treat this type of problem, and had said she would check with Dr. Romeo Apple and then call him back to let him know  Dr. Mort Sawyers response.  Aurea Graff relayed to me that Dr. Romeo Apple had stated that he does not treat this, and that he therefore would not see the patient, and that he recommends following up with his surgeon.  Aurea Graff then called back to the Emergency Department charge nurse Verlon Au, to relay that Dr. Romeo Apple will not see the patient, and that the recommendation is to see his surgeon in [place], and that she would notify the patient directly to relay this information to him.  She said she then proceeded to call  the patient, and said she left a detailed message explaining that he will not be able to have an appointment scheduled at our office, per Dr. Romeo Apple, as Dr. Romeo Apple does not treat, and that the recommendation is to follow up with his surgeon, due to recent surgery.  Message was left at his cell phone, [phone] (he has this phone # listed as home and cell # in [facility] system).   Aurea Graff states that the charge nurse had called her back to relay that she contacted patient's surgeon's office in [place], Dr. Case, and that Dr. Case advised and encouraged patient to keep his 06/17/13 scheduled appointment with him, which she will be sure to relay back to patient.    * I had been unaware that patient had called our office early this morning (before regular office hours) and therefore I gave the general protocol information to the Emergency Department, which is the obtaining of medical records, films, reports, for review.  Jacki Cones communication with patient and with Dr. Romeo Apple had not yet been entered into patient's chart notes.

## 2013-06-12 ENCOUNTER — Telehealth: Payer: Self-pay | Admitting: Orthopedic Surgery

## 2013-06-12 NOTE — Telephone Encounter (Signed)
Noted  

## 2013-06-12 NOTE — Telephone Encounter (Signed)
Documented phone call from 06/11/13 -

## 2013-06-13 NOTE — ED Provider Notes (Signed)
Medical screening examination/treatment/procedure(s) were performed by non-physician practitioner and as supervising physician I was immediately available for consultation/collaboration.  Kierrah Kilbride, MD 06/13/13 1628 

## 2013-06-16 ENCOUNTER — Emergency Department (HOSPITAL_COMMUNITY): Payer: Medicaid Other

## 2013-06-16 ENCOUNTER — Emergency Department (HOSPITAL_COMMUNITY)
Admission: EM | Admit: 2013-06-16 | Discharge: 2013-06-16 | Disposition: A | Discharge status: Home / Self Care | Payer: Medicaid Other | Attending: Emergency Medicine | Admitting: Emergency Medicine

## 2013-06-16 ENCOUNTER — Encounter (HOSPITAL_COMMUNITY): Payer: Self-pay | Admitting: Emergency Medicine

## 2013-06-16 DIAGNOSIS — F17200 Nicotine dependence, unspecified, uncomplicated: Secondary | ICD-10-CM | POA: Insufficient documentation

## 2013-06-16 DIAGNOSIS — M79641 Pain in right hand: Secondary | ICD-10-CM

## 2013-06-16 DIAGNOSIS — Z8619 Personal history of other infectious and parasitic diseases: Secondary | ICD-10-CM | POA: Insufficient documentation

## 2013-06-16 DIAGNOSIS — M79609 Pain in unspecified limb: Secondary | ICD-10-CM | POA: Insufficient documentation

## 2013-06-16 DIAGNOSIS — Z9889 Other specified postprocedural states: Secondary | ICD-10-CM | POA: Insufficient documentation

## 2013-06-16 DIAGNOSIS — J45909 Unspecified asthma, uncomplicated: Secondary | ICD-10-CM | POA: Insufficient documentation

## 2013-06-16 DIAGNOSIS — G8911 Acute pain due to trauma: Secondary | ICD-10-CM | POA: Insufficient documentation

## 2013-06-16 DIAGNOSIS — Z8781 Personal history of (healed) traumatic fracture: Secondary | ICD-10-CM | POA: Insufficient documentation

## 2013-06-16 MED ORDER — OXYCODONE-ACETAMINOPHEN 5-325 MG PO TABS: 1.0000 | ORAL_TABLET | ORAL | Status: DC | PRN

## 2013-06-16 MED ORDER — OXYCODONE-ACETAMINOPHEN 5-325 MG PO TABS
1.0000 | ORAL_TABLET | Freq: Once | ORAL | Status: DC
  Filled 2013-06-16: qty 1

## 2013-06-16 NOTE — ED Notes (Signed)
Patient sleeping soundly when RN entered room, difficult to wake patient, name called loudly several times.   AC called to room to observe, name called again - pt did not respond, pupils at 2mm and equal.  Sternal rub by [facility]- pt began to stir - still very drowsy.

## 2013-06-16 NOTE — ED Notes (Signed)
Patient states he had right hand surgery 4 weeks ago.  Patient states he fell in the shower and hit his hand about 2 weeks ago and c/o increased pain.  States went back to his [employer] (Dr. Case) and he told patient that a screw was backing out and there was nothing he could do for it.

## 2013-06-16 NOTE — ED Notes (Signed)
Girlfriend in to talk with patient - states she irritated him enough to wake him up - she states he took vicks nyquil before he came to the ED.   Patient now cursing loudly that he is ready to go. Rx given to patient to fill later today, he and girlfriend acknowledge dc instructions and follow-up

## 2013-06-16 NOTE — ED Notes (Signed)
States he has taken all of his pain medication and needs something done for his hand.

## 2013-06-16 NOTE — ED Provider Notes (Signed)
CSN: [id number]     Arrival date & time 06/16/13  0213 History   First MD Initiated Contact with Patient 06/16/13 [phone]     Chief Complaint  Patient presents with  . Hand Pain   (Consider location/radiation/quality/duration/timing/severity/associated sxs/prior Treatment) Patient is a 17 y.o. male presenting with hand pain. The history is provided by the patient.  Hand Pain  He suffered fractures to his right hand about 4 weeks ago and had surgery with placement of plates and screws. 2 weeks ago, he fell in the shower and has been having increasing pain in his right hand since then. He saw his surgeon who told him that some screws were backing out and there is nothing to do for it. He is complaining of severe pain in his right hand on the ulnar side and states he cannot move his fourth and fifth fingers. He has been taking ibuprofen for pain with no relief. He had been on hydrocodone-acetaminophen previously but has run out.  Past Medical History  Diagnosis Date  . Herpes genitalis in men   . Asthma    Past Surgical History  Procedure Laterality Date  . Appendectomy    . Fracture surgery     Family History  Problem Relation Age of Onset  . Kidney failure Mother   . COPD Mother   . Hypertension Mother   . Cancer Other   . Asthma Other   . Heart failure Other    History  Substance Use Topics  . Smoking status: Current Every Day Smoker -- 0.50 packs/day    Types: Cigarettes  . Smokeless tobacco: Not on file  . Alcohol Use: No    Review of Systems  All other systems reviewed and are negative.    Allergies  Sulfonamide derivatives  Home Medications   Current Outpatient Rx  Name  Route  Sig  Dispense  Refill  . ibuprofen (ADVIL,MOTRIN) 600 MG tablet   Oral   Take 1 tablet (600 mg total) by mouth every 6 (six) hours as needed for pain.   30 tablet   0   . HYDROcodone-acetaminophen (NORCO/VICODIN) 5-325 MG per tablet   Oral   Take 1 tablet by mouth every 4 (four)  hours as needed.   15 tablet   0    BP 133/63  Pulse 88  Temp(Src) 98.7 F (37.1 C) (Oral)  Resp 20  Ht 5\' 8"  (1.727 m)  Wt 160 lb (72.576 kg)  BMI 24.33 kg/m2  SpO2 100% Physical Exam  Nursing note and vitals reviewed.  17 year old male, resting comfortably and in no acute distress. Vital signs are normal. Oxygen saturation is 100%, which is normal. Head is normocephalic and atraumatic. PERRLA, EOMI. Oropharynx is clear. Neck is nontender and supple without adenopathy or JVD. Back is nontender and there is no CVA tenderness. Lungs are clear without rales, wheezes, or rhonchi. Chest is nontender. Heart has regular rate and rhythm without murmur. Abdomen is soft, flat, nontender without masses or hepatosplenomegaly and peristalsis is normoactive. Extremities: There is mild swelling over the ulnar aspect of the right hand with surgical scar present. This area is somewhat tender to palpation. There is flexion contracture of the fourth and fifth fingers and marked pain is associated with the intent to move the fingers. There is no erythema or warmth. Neurovascular exam is intact with normal sensation and prompt capillary refill. Skin is warm and dry without rash. Neurologic: Mental status is normal, cranial nerves are intact, there  are no motor or sensory deficits.  ED Course  Procedures (including critical care time)  Imaging Review Dg Hand Complete Right  06/16/2013   *RADIOLOGY REPORT*  Clinical Data: Pain.  RIGHT HAND - COMPLETE 3+ VIEW  Comparison: 06/11/2013.  Findings: Dorsal plate and screw fixation of the fourth and fifth metacarpals.  No adverse features.  Shaft fractures remain evident, without interval displacement.  Post traumatic deformity of the ring finger PIP joint.  No new osseous findings.  IMPRESSION:   No acute findings.  No adverse features related to plate fixation of the fourth and fifth metacarpals.   Original Report Authenticated By: Tiburcio Pea   Images  viewed by me.   MDM   1. Pain of right hand    Right hand pain status post open reduction internal fixation of metacarpal fractures. Old records are reviewed and he was seen in the ED 4 days ago for similar complaints. X-rays at that time showed no displacement of hardware. X-rays will be repeated today and he'll be referred to hand surgery for followup. He states that he does not wish to go back to the surgeon who operated on him.    Dione Booze, MD 06/16/13 [phone]

## 2013-06-16 NOTE — ED Notes (Signed)
Extreme difficulty waking patient - charge nurse called to room to witness -  When patient did wake up, speech slurred and poor focus and interaction with RN's.  Pain medication not given.

## 2013-06-16 NOTE — ED Notes (Signed)
Patient called out to desk - requesting something for pain now- states it is excruciating.

## 2013-08-19 ENCOUNTER — Encounter (HOSPITAL_COMMUNITY): Payer: Self-pay | Admitting: Emergency Medicine

## 2013-08-19 ENCOUNTER — Emergency Department (HOSPITAL_COMMUNITY)
Admission: EM | Admit: 2013-08-19 | Discharge: 2013-08-19 | Disposition: A | Discharge status: Home / Self Care | Payer: Medicaid Other | Attending: Emergency Medicine | Admitting: Emergency Medicine

## 2013-08-19 ENCOUNTER — Emergency Department (HOSPITAL_COMMUNITY): Payer: Medicaid Other

## 2013-08-19 DIAGNOSIS — Z8619 Personal history of other infectious and parasitic diseases: Secondary | ICD-10-CM | POA: Insufficient documentation

## 2013-08-19 DIAGNOSIS — Y9389 Activity, other specified: Secondary | ICD-10-CM | POA: Insufficient documentation

## 2013-08-19 DIAGNOSIS — F17200 Nicotine dependence, unspecified, uncomplicated: Secondary | ICD-10-CM | POA: Insufficient documentation

## 2013-08-19 DIAGNOSIS — T148XXA Other injury of unspecified body region, initial encounter: Secondary | ICD-10-CM

## 2013-08-19 DIAGNOSIS — S60229A Contusion of unspecified hand, initial encounter: Secondary | ICD-10-CM | POA: Insufficient documentation

## 2013-08-19 DIAGNOSIS — W230XXA Caught, crushed, jammed, or pinched between moving objects, initial encounter: Secondary | ICD-10-CM | POA: Insufficient documentation

## 2013-08-19 DIAGNOSIS — Y9289 Other specified places as the place of occurrence of the external cause: Secondary | ICD-10-CM | POA: Insufficient documentation

## 2013-08-19 DIAGNOSIS — J45909 Unspecified asthma, uncomplicated: Secondary | ICD-10-CM | POA: Insufficient documentation

## 2013-08-19 MED ORDER — IBUPROFEN 800 MG PO TABS: 800.0000 mg | ORAL_TABLET | Freq: Three times a day (TID) | ORAL | Status: DC

## 2013-08-19 MED ORDER — HYDROCODONE-ACETAMINOPHEN 5-325 MG PO TABS
2.0000 | ORAL_TABLET | Freq: Once | ORAL | Status: AC
  Administered 2013-08-19: 2 via ORAL
  Filled 2013-08-19: qty 2

## 2013-08-19 MED ORDER — TRAMADOL HCL 50 MG PO TABS: 50.0000 mg | ORAL_TABLET | Freq: Four times a day (QID) | ORAL | Status: DC | PRN

## 2013-08-19 NOTE — ED Notes (Signed)
Pt alert & oriented x4, stable gait. Patient given discharge instructions, paperwork & prescription(s). Patient  instructed to stop at the registration desk to finish any additional paperwork. Patient verbalized understanding. Pt left department w/ no further questions. 

## 2013-08-19 NOTE — ED Notes (Signed)
Pt reports having a concrete block dropped on right hand earlier today.  No relief from pain with ice or Tylenol.

## 2013-08-19 NOTE — ED Provider Notes (Signed)
CSN: [id number]     Arrival date & time 08/19/13  1935 History   First MD Initiated Contact with Patient 08/19/13 2004     Chief Complaint  Patient presents with  . Hand Pain   (Consider location/radiation/quality/duration/timing/severity/associated sxs/prior Treatment) HPI Comments: Patient reports a concrete block was dropped on his right hand earlier today. This has had constant pain in the hand since. Patient reports a severe pain that is throbbing in nature. He has taken Tylenol without relief. He has tried ice the area but has not helped.  Patient is a 17 y.o. male presenting with hand pain.  Hand Pain    Past Medical History  Diagnosis Date  . Herpes genitalis in men   . Asthma    Past Surgical History  Procedure Laterality Date  . Appendectomy    . Fracture surgery     Family History  Problem Relation Age of Onset  . Kidney failure Mother   . COPD Mother   . Hypertension Mother   . Cancer Other   . Asthma Other   . Heart failure Other    History  Substance Use Topics  . Smoking status: Current Every Day Smoker -- 0.50 packs/day    Types: Cigarettes  . Smokeless tobacco: Not on file  . Alcohol Use: No    Review of Systems  Musculoskeletal:       Hand pain    Allergies  Sulfonamide derivatives  Home Medications   Current Outpatient Rx  Name  Route  Sig  Dispense  Refill  . HYDROcodone-acetaminophen (NORCO/VICODIN) 5-325 MG per tablet   Oral   Take 1 tablet by mouth every 4 (four) hours as needed.   15 tablet   0   . ibuprofen (ADVIL,MOTRIN) 600 MG tablet   Oral   Take 1 tablet (600 mg total) by mouth every 6 (six) hours as needed for pain.   30 tablet   0   . oxyCODONE-acetaminophen (PERCOCET/ROXICET) 5-325 MG per tablet   Oral   Take 1 tablet by mouth every 4 (four) hours as needed for pain.   20 tablet   0    BP 116/52  Pulse 100  Temp(Src) 98.7 F (37.1 C) (Oral)  Resp 18  Ht 5\' 9"  (1.753 m)  Wt 155 lb (70.308 kg)  BMI 22.88  kg/m2  SpO2 100% Physical Exam  Musculoskeletal:       Right hand: He exhibits tenderness. He exhibits normal range of motion, normal capillary refill, no deformity and no laceration. Normal sensation noted. Normal strength noted.       Hands: Neurological: He is alert. He has normal strength. No cranial nerve deficit or sensory deficit. GCS eye subscore is 4. GCS verbal subscore is 5. GCS motor subscore is 6.  Skin: Skin is warm, dry and intact.    ED Course  Procedures (including critical care time) Labs Review Labs Reviewed - No data to display Imaging Review Dg Hand Complete Right  08/19/2013   CLINICAL DATA:  Pain post trauma  EXAM: RIGHT HAND - COMPLETE 3+ VIEW  COMPARISON:  June 16, 2013  FINDINGS: Frontal, oblique, and lateral views were obtained. There is screw and plate fixation to the 4th and 5th metacarpals. There is no acute fracture or dislocation. 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 The other joint spaces appear unremarkable. No erosive change.  IMPRESSION: Postoperative change in the 4th and 5th metacarpals. No acute fracture or dislocation. 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   On: 08/19/2013 20:14    EKG Interpretation   None       MDM  Diagnosis: Contusion, right hand  Patient presents to the ER for evaluation of injury to the right hand. Patient reports that he suffered a crush injury when a concrete block was dropped on his hand. Patient has tenderness diffusely over the dorsal aspect of the hand. He does have a scar from previous surgery. X-ray was performed and there were no acute findings. Patient will continue with analgesics and rest, followup with Doctor Case, his orthopedist as needed.    Gilda Crease, MD 08/19/13 2021

## 2013-08-19 NOTE — ED Notes (Signed)
Pt had a concrete block land on his right hand. Pt has good sensation & cap refill. Pt states he has limited movement of his fourth & fifth finger. Slight swelling noted to the top of hand.

## 2013-08-19 NOTE — ED Notes (Signed)
Pt mother called to ER 2 times requesting the EDP give pt something stronger for pain. Spoke w/ the EDP & he advised pt was given appropriate medication for the injury pt presented to the ER. This was explained to mother.

## 2013-08-22 ENCOUNTER — Emergency Department (HOSPITAL_COMMUNITY)
Admission: EM | Admit: 2013-08-22 | Discharge: 2013-08-22 | Disposition: A | Discharge status: Home / Self Care | Payer: Medicaid Other | Attending: Emergency Medicine | Admitting: Emergency Medicine

## 2013-08-22 ENCOUNTER — Encounter (HOSPITAL_COMMUNITY): Payer: Self-pay | Admitting: Emergency Medicine

## 2013-08-22 DIAGNOSIS — J45909 Unspecified asthma, uncomplicated: Secondary | ICD-10-CM | POA: Insufficient documentation

## 2013-08-22 DIAGNOSIS — Z8619 Personal history of other infectious and parasitic diseases: Secondary | ICD-10-CM | POA: Insufficient documentation

## 2013-08-22 DIAGNOSIS — M79609 Pain in unspecified limb: Secondary | ICD-10-CM | POA: Insufficient documentation

## 2013-08-22 DIAGNOSIS — G8911 Acute pain due to trauma: Secondary | ICD-10-CM | POA: Insufficient documentation

## 2013-08-22 DIAGNOSIS — F17200 Nicotine dependence, unspecified, uncomplicated: Secondary | ICD-10-CM | POA: Insufficient documentation

## 2013-08-22 DIAGNOSIS — M79641 Pain in right hand: Secondary | ICD-10-CM

## 2013-08-22 MED ORDER — PROMETHAZINE HCL 12.5 MG PO TABS
12.5000 mg | ORAL_TABLET | Freq: Once | ORAL | Status: AC
  Administered 2013-08-22: 12.5 mg via ORAL
  Filled 2013-08-22: qty 1

## 2013-08-22 MED ORDER — ACETAMINOPHEN-CODEINE #3 300-30 MG PO TABS
2.0000 | ORAL_TABLET | Freq: Once | ORAL | Status: AC
  Administered 2013-08-22: 2 via ORAL
  Filled 2013-08-22: qty 2

## 2013-08-22 MED ORDER — KETOROLAC TROMETHAMINE 10 MG PO TABS
10.0000 mg | ORAL_TABLET | Freq: Once | ORAL | Status: AC
  Administered 2013-08-22: 10 mg via ORAL
  Filled 2013-08-22: qty 1

## 2013-08-22 NOTE — ED Provider Notes (Signed)
Medical screening examination/treatment/procedure(s) were performed by non-physician practitioner and as supervising physician I was immediately available for consultation/collaboration.  EKG Interpretation   None         Shelda Jakes, MD 08/22/13 1040

## 2013-08-22 NOTE — ED Notes (Signed)
Right Hand injury sustained Tuesday, versalock brick dropped on hand.  Has prior hand surgery.  Was seen here same day, given Motrin and Ultram rx.  States meds are not helping pain and it is keeping him up at night.

## 2013-08-22 NOTE — ED Provider Notes (Signed)
CSN: [id number]     Arrival date & time 08/22/13  7829 History   First MD Initiated Contact with Patient 08/22/13 0901     Chief Complaint  Patient presents with  . Hand Injury   (Consider location/radiation/quality/duration/timing/severity/associated sxs/prior Treatment) Patient is a 17 y.o. male presenting with hand injury. The history is provided by the patient.  Hand Injury Location:  Hand Time since incident:  3 days Hand location:  R hand Pain details:    Quality:  Aching and throbbing   Severity:  Severe   Onset quality:  Gradual   Duration:  3 days   Timing:  Intermittent   Progression:  Worsening Chronicity:  Recurrent Handedness:  Right-handed Dislocation: no   Prior injury to area:  Yes Relieved by:  Nothing Ineffective treatments:  NSAIDs and narcotics Associated symptoms: decreased range of motion   Associated symptoms: no back pain, no fever and no neck pain     Past Medical History  Diagnosis Date  . Herpes genitalis in men   . Asthma    Past Surgical History  Procedure Laterality Date  . Appendectomy    . Fracture surgery     Family History  Problem Relation Age of Onset  . Kidney failure Mother   . COPD Mother   . Hypertension Mother   . Cancer Other   . Asthma Other   . Heart failure Other    History  Substance Use Topics  . Smoking status: Current Every Day Smoker -- 0.50 packs/day    Types: Cigarettes  . Smokeless tobacco: Not on file  . Alcohol Use: No    Review of Systems  Constitutional: Negative for fever and activity change.       All ROS Neg except as noted in HPI  HENT: Negative for nosebleeds.   Eyes: Negative for photophobia and discharge.  Respiratory: Negative for cough, shortness of breath and wheezing.   Cardiovascular: Negative for chest pain and palpitations.  Gastrointestinal: Negative for abdominal pain and blood in stool.  Genitourinary: Negative for dysuria, frequency and hematuria.  Musculoskeletal: Negative for  arthralgias, back pain and neck pain.  Skin: Negative.   Neurological: Negative for dizziness, seizures and speech difficulty.  Psychiatric/Behavioral: Negative for hallucinations and confusion.    Allergies  Sulfa antibiotics  Home Medications   Current Outpatient Rx  Name  Route  Sig  Dispense  Refill  . ibuprofen (ADVIL,MOTRIN) 800 MG tablet   Oral   Take 1 tablet (800 mg total) by mouth 3 (three) times daily.   21 tablet   0   . traMADol (ULTRAM) 50 MG tablet   Oral   Take 1 tablet (50 mg total) by mouth every 6 (six) hours as needed.   15 tablet   0    BP 120/87  Pulse 74  Temp(Src) 98.1 F (36.7 C) (Oral)  Resp 16  Ht 5\' 9"  (1.753 m)  Wt 155 lb (70.308 kg)  BMI 22.88 kg/m2  SpO2 99% Physical Exam  Nursing note and vitals reviewed. Constitutional: He is oriented to person, place, and time. He appears well-developed and well-nourished.  Non-toxic appearance.  HENT:  Head: Normocephalic.  Right Ear: Tympanic membrane and external ear normal.  Left Ear: Tympanic membrane and external ear normal.  Eyes: EOM and lids are normal. Pupils are equal, round, and reactive to light.  Neck: Normal range of motion. Neck supple. Carotid bruit is not present.  Cardiovascular: Normal rate, regular rhythm, normal heart sounds, intact  distal pulses and normal pulses.   Pulmonary/Chest: Breath sounds normal. No respiratory distress.  Abdominal: Soft. Bowel sounds are normal. There is no tenderness. There is no guarding.  Musculoskeletal: Normal range of motion.  Well healed surgical scar of the dorsum of the right hand. Cap refill less than 2 sec of the right hand. Pain to palpation and with attempted flex and ext of the fingers or the right hand. FROM of the right shoulder and ellbow. Radial pulses 2+ bilat. No hot areas. No red streaking.  Lymphadenopathy:       Head (right side): No submandibular adenopathy present.       Head (left side): No submandibular adenopathy present.     He has no cervical adenopathy.  Neurological: He is alert and oriented to person, place, and time. He has normal strength. No cranial nerve deficit or sensory deficit.  Skin: Skin is warm and dry.  Psychiatric: He has a normal mood and affect. His speech is normal.    ED Course  Procedures (including critical care time) Labs Review Labs Reviewed - No data to display Imaging Review No results found.  EKG Interpretation   None       MDM  No diagnosis found. *I have reviewed nursing notes, vital signs, and all appropriate lab and imaging results for this patient.**  Vital signs stable. No evidence for infection. No significant swelling. Pulses and cap refill wnl. I have reviewed the xrays from the previous visit. No fx noted. Pt referred to Dr Case who did his surgery ; Pt states he is not satisfied with Dr Madaline Guthrie and has not seen him for follow up since the 11/4 injury. He request to see a Hydrographic surveyor in [place]. Dr Izora Ribas on call.  Kathie Dike, PA-C 08/22/13 1017

## 2013-09-17 ENCOUNTER — Encounter (HOSPITAL_COMMUNITY): Payer: Self-pay | Admitting: Emergency Medicine

## 2013-09-17 ENCOUNTER — Emergency Department (HOSPITAL_COMMUNITY)
Admission: EM | Admit: 2013-09-17 | Discharge: 2013-09-17 | Disposition: A | Discharge status: Home / Self Care | Payer: Medicaid Other | Attending: Emergency Medicine | Admitting: Emergency Medicine

## 2013-09-17 ENCOUNTER — Emergency Department (HOSPITAL_COMMUNITY): Payer: Medicaid Other

## 2013-09-17 DIAGNOSIS — F17200 Nicotine dependence, unspecified, uncomplicated: Secondary | ICD-10-CM | POA: Insufficient documentation

## 2013-09-17 DIAGNOSIS — Z8619 Personal history of other infectious and parasitic diseases: Secondary | ICD-10-CM | POA: Insufficient documentation

## 2013-09-17 DIAGNOSIS — J069 Acute upper respiratory infection, unspecified: Secondary | ICD-10-CM | POA: Insufficient documentation

## 2013-09-17 DIAGNOSIS — J45901 Unspecified asthma with (acute) exacerbation: Secondary | ICD-10-CM | POA: Insufficient documentation

## 2013-09-17 MED ORDER — ACETAMINOPHEN 325 MG PO TABS
650.0000 mg | ORAL_TABLET | Freq: Once | ORAL | Status: AC
  Administered 2013-09-17: 650 mg via ORAL
  Filled 2013-09-17: qty 2

## 2013-09-17 MED ORDER — ALBUTEROL SULFATE HFA 108 (90 BASE) MCG/ACT IN AERS: 2.0000 | INHALATION_SPRAY | RESPIRATORY_TRACT | Status: DC | PRN

## 2013-09-17 MED ORDER — GUAIFENESIN 100 MG/5ML PO LIQD: 100.0000 mg | ORAL | Status: DC | PRN

## 2013-09-17 NOTE — ED Provider Notes (Signed)
CSN: [id number]     Arrival date & time 09/17/13  0940 History   First MD Initiated Contact with Patient 09/17/13 [phone]     Chief Complaint  Patient presents with  . Cough  . Sore Throat   (Consider location/radiation/quality/duration/timing/severity/associated sxs/prior Treatment) HPI Comments: Patient presents to the emergency department with chief complaint of sore throat and cough x3-4 days. He states that he has associated sore throat, and sinus congestion. Has past medical history remarkable for asthma. He denies associated chest pain, nausea, vomiting, or abdominal pain. He has tried taking OTC cough and cold medicine with some relief. He reports no other symptoms at this time.  The history is provided by the patient. No language interpreter was used.    Past Medical History  Diagnosis Date  . Herpes genitalis in men   . Asthma    Past Surgical History  Procedure Laterality Date  . Appendectomy    . Fracture surgery     Family History  Problem Relation Age of Onset  . Kidney failure Mother   . COPD Mother   . Hypertension Mother   . Cancer Other   . Asthma Other   . Heart failure Other    History  Substance Use Topics  . Smoking status: Current Every Day Smoker -- 0.50 packs/day    Types: Cigarettes  . Smokeless tobacco: Not on file  . Alcohol Use: No    Review of Systems  All other systems reviewed and are negative.    Allergies  Sulfa antibiotics  Home Medications   Current Outpatient Rx  Name  Route  Sig  Dispense  Refill  . OVER THE COUNTER MEDICATION   Oral   Take 2 tablets by mouth every 6 (six) hours as needed (for sinus pressure.).          BP 158/74  Pulse 115  Temp(Src) 99 F (37.2 C) (Oral)  Resp 22  Ht 5\' 8"  (1.727 m)  Wt 160 lb (72.576 kg)  BMI 24.33 kg/m2  SpO2 100% Physical Exam  Nursing note and vitals reviewed. Constitutional: He is oriented to person, place, and time. He appears well-developed and well-nourished.  HENT:   Head: Normocephalic and atraumatic.  Right Ear: External ear normal.  Left Ear: External ear normal.  Nose: Nose normal.  Mouth/Throat: No oropharyngeal exudate.  Oropharynx is erythematous, but no exudates, no signs of tonsillar or peritonsillar abscess, uvula is midline, airway is intact  Eyes: Conjunctivae and EOM are normal. Pupils are equal, round, and reactive to light. Right eye exhibits no discharge. Left eye exhibits no discharge. No scleral icterus.  Neck: Normal range of motion. Neck supple. No JVD present.  Cardiovascular: Normal rate, regular rhythm, normal heart sounds and intact distal pulses.  Exam reveals no gallop and no friction rub.   No murmur heard. Pulmonary/Chest: Effort normal. No respiratory distress. He has wheezes. He has no rales. He exhibits no tenderness.  Mild end expiratory wheezes diffusely  Abdominal: Soft. He exhibits no distension.  Musculoskeletal: Normal range of motion. He exhibits no edema and no tenderness.  Neurological: He is alert and oriented to person, place, and time.  Skin: Skin is warm and dry.  Psychiatric: He has a normal mood and affect. His behavior is normal. Judgment and thought content normal.    ED Course  Procedures (including critical care time) Dg Chest 2 View  09/17/2013   CLINICAL DATA:  Chest pain with cough and congestion  EXAM: CHEST  2  VIEW  COMPARISON:  03/11/2013  FINDINGS: Pulmonary hyperinflation. Question asthma. Negative for infiltrate or effusion. Heart size and vascularity are normal.  IMPRESSION: Pulmonary hyperinflation.  No superimposed infiltrate.   Electronically Signed   By: Marlan Palau M.D.   On: 09/17/2013 11:19       EKG Interpretation   None       MDM   1. URI (upper respiratory infection)    Pt CXR negative for acute infiltrate. Patients symptoms are consistent with URI, likely viral etiology. Discussed that antibiotics are not indicated for viral infections. Pt will be discharged with  symptomatic treatment.  Verbalizes understanding and is agreeable with plan. Pt is hemodynamically stable & in NAD prior to dc.  Filed Vitals:   09/17/13 1124  BP: 131/75  Pulse: 76  Temp: 98.4 F (36.9 C)  Resp: [address] [place] 09/17/13 1129

## 2013-09-17 NOTE — ED Notes (Signed)
Pt c/o cough, fever, body aches x3-4 days. Pt states cough in productive with clear-yellow sputum.

## 2013-09-17 NOTE — ED Notes (Signed)
C/o sore throat and cough and fever at home for several days.

## 2013-09-19 NOTE — ED Provider Notes (Signed)
Medical screening examination/treatment/procedure(s) were performed by non-physician practitioner and as supervising physician I was immediately available for consultation/collaboration.  EKG Interpretation   None         Shelda Jakes, MD 09/19/13 [phone]

## 2013-11-16 ENCOUNTER — Emergency Department (HOSPITAL_COMMUNITY): Payer: Medicaid Other

## 2013-11-16 ENCOUNTER — Emergency Department (HOSPITAL_COMMUNITY)
Admission: EM | Admit: 2013-11-16 | Discharge: 2013-11-16 | Disposition: A | Discharge status: Home / Self Care | Payer: Medicaid Other | Attending: Emergency Medicine | Admitting: Emergency Medicine

## 2013-11-16 ENCOUNTER — Encounter (HOSPITAL_COMMUNITY): Payer: Self-pay | Admitting: Emergency Medicine

## 2013-11-16 DIAGNOSIS — R6889 Other general symptoms and signs: Secondary | ICD-10-CM

## 2013-11-16 DIAGNOSIS — R209 Unspecified disturbances of skin sensation: Secondary | ICD-10-CM | POA: Insufficient documentation

## 2013-11-16 DIAGNOSIS — Z8619 Personal history of other infectious and parasitic diseases: Secondary | ICD-10-CM | POA: Insufficient documentation

## 2013-11-16 DIAGNOSIS — Z79899 Other long term (current) drug therapy: Secondary | ICD-10-CM | POA: Insufficient documentation

## 2013-11-16 DIAGNOSIS — R61 Generalized hyperhidrosis: Secondary | ICD-10-CM | POA: Insufficient documentation

## 2013-11-16 DIAGNOSIS — J111 Influenza due to unidentified influenza virus with other respiratory manifestations: Secondary | ICD-10-CM | POA: Insufficient documentation

## 2013-11-16 DIAGNOSIS — H9319 Tinnitus, unspecified ear: Secondary | ICD-10-CM | POA: Insufficient documentation

## 2013-11-16 DIAGNOSIS — Z8669 Personal history of other diseases of the nervous system and sense organs: Secondary | ICD-10-CM | POA: Insufficient documentation

## 2013-11-16 DIAGNOSIS — J45909 Unspecified asthma, uncomplicated: Secondary | ICD-10-CM | POA: Insufficient documentation

## 2013-11-16 DIAGNOSIS — F17200 Nicotine dependence, unspecified, uncomplicated: Secondary | ICD-10-CM | POA: Insufficient documentation

## 2013-11-16 LAB — CBC WITH DIFFERENTIAL/PLATELET
BASOS ABS: 0 10*3/uL (ref 0.0–0.1)
Basophils Relative: 0 % (ref 0–1)
Eosinophils Absolute: 0 10*3/uL (ref 0.0–0.7)
Eosinophils Relative: 0 % (ref 0–5)
HCT: 47.7 % (ref 39.0–52.0)
Hemoglobin: 16.9 g/dL (ref 13.0–17.0)
LYMPHS PCT: 30 % (ref 12–46)
Lymphs Abs: 3.2 10*3/uL (ref 0.7–4.0)
MCH: 29.5 pg (ref 26.0–34.0)
MCHC: 35.4 g/dL (ref 30.0–36.0)
MCV: 83.2 fL (ref 78.0–100.0)
Monocytes Absolute: 0.7 10*3/uL (ref 0.1–1.0)
Monocytes Relative: 7 % (ref 3–12)
NEUTROS PCT: 62 % (ref 43–77)
Neutro Abs: 6.6 10*3/uL (ref 1.7–7.7)
PLATELETS: 224 10*3/uL (ref 150–400)
RBC: 5.73 MIL/uL (ref 4.22–5.81)
RDW: 13.4 % (ref 11.5–15.5)
WBC: 10.5 10*3/uL (ref 4.0–10.5)

## 2013-11-16 MED ORDER — PROMETHAZINE-CODEINE 6.25-10 MG/5ML PO SYRP: 5.0000 mL | ORAL_SOLUTION | ORAL | Status: DC | PRN

## 2013-11-16 MED ORDER — ONDANSETRON 8 MG PO TBDP
8.0000 mg | ORAL_TABLET | Freq: Once | ORAL | Status: AC
Start: 2013-11-16 — End: 2013-11-16
  Administered 2013-11-16: 8 mg via ORAL
  Filled 2013-11-16: qty 1

## 2013-11-16 MED ORDER — ALPRAZOLAM 0.5 MG PO TABS
1.0000 mg | ORAL_TABLET | Freq: Once | ORAL | Status: AC
  Administered 2013-11-16: 1 mg via ORAL
  Filled 2013-11-16: qty 2

## 2013-11-16 MED ORDER — OSELTAMIVIR PHOSPHATE 75 MG PO CAPS: 75.0000 mg | ORAL_CAPSULE | Freq: Two times a day (BID) | ORAL | Status: DC

## 2013-11-16 NOTE — ED Provider Notes (Signed)
CSN: [id number]     Arrival date & time 11/16/13  1433 History  This chart was scribed for non-physician practitioner, Burgess AmorJulie Mc Bloodworth, PA-C,working with Gerhard Munchobert Lockwood, MD, by Karle PlumberJennifer Tensley, ED Scribe.  This patient was seen in room APFT22/APFT22 and the patient's care was started at 2:55 PM.  Chief Complaint  Patient presents with  . flu like sx    The history is provided by the patient and a parent. No language interpreter was used.   HPI Comments:  Donald Marshall is a 18 y.o. male, brought in by ambulance, who presents to the Emergency Department complaining of gradual onset generalized body aches, light-headedness, dizziness, nonproductive cough and nasal congestion for the past two days. Pt reports being exposed to meningitis approximately two weeks ago and his mother expresses repeated concerns for this. He reports associated subjective fever, nausea with dry heaves and a headache. He reports a tingling feeling upon walking. His mother states he did not know what the day of the week it was this morning which was unusual. Pt states he last ate a burrito last night and did not vomit afterwards. Pt states he has not drank much today, but drank normally yesterday. Pt reports taking Ibuprofen 1600 mg with no relief. He denies sore throat or numbness of BLE. Pt reports he is a smoker. He denies having a flu vaccination this season.    Past Medical History  Diagnosis Date  . Herpes genitalis in men   . Asthma    Past Surgical History  Procedure Laterality Date  . Appendectomy    . Fracture surgery     Family History  Problem Relation Age of Onset  . Kidney failure Mother   . COPD Mother   . Hypertension Mother   . Cancer Other   . Asthma Other   . Heart failure Other    History  Substance Use Topics  . Smoking status: Current Every Day Smoker -- 0.50 packs/day    Types: Cigarettes  . Smokeless tobacco: Not on file  . Alcohol Use: No    Review of Systems  Constitutional:  Positive for chills and diaphoresis. Negative for fever.  HENT: Positive for congestion, rhinorrhea, sore throat and tinnitus (mild). Negative for ear pain, sinus pressure, trouble swallowing and voice change.   Eyes: Negative for discharge.  Respiratory: Positive for cough. Negative for shortness of breath, wheezing and stridor.   Cardiovascular: Negative for chest pain.  Gastrointestinal: Positive for nausea. Negative for abdominal pain.  Genitourinary: Negative.   Musculoskeletal: Positive for myalgias.  Neurological: Positive for dizziness and headaches. Negative for numbness.    Allergies  Sulfa antibiotics  Home Medications   Current Outpatient Rx  Name  Route  Sig  Dispense  Refill  . albuterol (PROVENTIL HFA;VENTOLIN HFA) 108 (90 BASE) MCG/ACT inhaler   Inhalation   Inhale 2 puffs into the lungs every 4 (four) hours as needed for wheezing or shortness of breath.   1 Inhaler   3   . ibuprofen (ADVIL,MOTRIN) 200 MG tablet   Oral   Take 800 mg by mouth every 6 (six) hours as needed. pain         . oseltamivir (TAMIFLU) 75 MG capsule   Oral   Take 1 capsule (75 mg total) by mouth every 12 (twelve) hours.   10 capsule   0   . promethazine-codeine (PHENERGAN WITH CODEINE) 6.25-10 MG/5ML syrup   Oral   Take 5 mLs by mouth every 4 (four) hours  as needed for cough.   75 mL   0    Triage Vitals: Pulse 114  Temp(Src) 99 F (37.2 C) (Oral)  Resp 16  Ht 5\' 8"  (1.727 m)  Wt 155 lb (70.308 kg)  BMI 23.57 kg/m2  SpO2 98% Physical Exam  Constitutional: He is oriented to person, place, and time. He appears well-developed and well-nourished.  HENT:  Head: Normocephalic and atraumatic.  Right Ear: Tympanic membrane and ear canal normal.  Left Ear: Tympanic membrane and ear canal normal.  Nose: Nose normal. No mucosal edema or rhinorrhea.  Mouth/Throat: Uvula is midline and mucous membranes are normal. Posterior oropharyngeal erythema present. No oropharyngeal exudate,  posterior oropharyngeal edema or tonsillar abscesses.  White coating on tongue. Pharynx mildly hyperemic.  Eyes: Conjunctivae are normal.  Neck: No spinous process tenderness present. No rigidity. Normal range of motion present.  No nuchal rigidity,   Cardiovascular: Normal rate, regular rhythm and normal heart sounds.  Exam reveals no gallop and no friction rub.   No murmur heard. Pulmonary/Chest: Effort normal. No respiratory distress. He has no wheezes. He has no rales.  Slightly decreased breath sounds at left base.  Abdominal: Soft. Bowel sounds are normal. There is no tenderness.  Musculoskeletal: Normal range of motion.  Neurological: He is alert and oriented to person, place, and time. He has normal strength. No cranial nerve deficit or sensory deficit. Gait normal.  Negative pronator drift.  Skin: Skin is warm and dry. No rash noted.  Psychiatric: He has a normal mood and affect.    ED Course  Procedures (including critical care time) DIAGNOSTIC STUDIES: Oxygen Saturation is 98% on RA, normal by my interpretation.   COORDINATION OF CARE: 3:05 PM- Will give Xanax and Zofran. Will order CXR. Pt verbalizes understanding and agrees to plan.  Medications  ondansetron (ZOFRAN-ODT) disintegrating tablet 8 mg (8 mg Oral Given 11/16/13 1536)  ALPRAZolam (XANAX) tablet 1 mg (1 mg Oral Given 11/16/13 1552)    Labs Review Labs Reviewed  CBC WITH DIFFERENTIAL   Imaging Review Dg Chest 2 View  11/16/2013   CLINICAL DATA:  Smoker with 2 day history of cough, chest congestion, shortness of breath, fever, generalized weakness and body aches.  EXAM: CHEST  2 VIEW  COMPARISON:  DG CHEST 2 VIEW dated 09/17/2013; DG CHEST 2 VIEW dated 03/11/2013; DG CHEST 2 VIEW dated 06/16/2008  FINDINGS: Cardiomediastinal silhouette unremarkable and unchanged. Stable hyperinflation. Lungs clear. Bronchovascular markings normal. Pulmonary vascularity normal. No visible pleural effusions. No pneumothorax. Visualized  bony thorax intact.  IMPRESSION: Stable mild hyperinflation which may be related to asthma or and excellent inspiratory effort. No acute cardiopulmonary disease.   Electronically Signed   By: Hulan Saas M.D.   On: 11/16/2013 16:00    EKG Interpretation   None       MDM   1. Flu-like symptoms    Reassurance given mother that the timing of his exposure to meningitis was too long ago to be the source of sx today.  Sx far more consistent with viral infection, possibly influenza.  Exam not c/w meningitis.   Labs, xrays reviewed and discussed with pt and mother.  He was very anxious during this ed visit, stating he didn't want to be here,  Mother made him come.  Mother states he missed his normal xanax dose at noon.  Discussed pro's and con's of tamiflu.  Mother opts to get this prescription.  Also prescribed phenergan/codeine for cough and body aches.  The  patient appears reasonably screened and/or stabilized for discharge and I doubt any other medical condition or other [facility] requiring further screening, evaluation, or treatment in the ED at this time prior to discharge.   I personally performed the services described in this documentation, which was scribed in my presence. The recorded information has been reviewed and is accurate.    Burgess Amor, PA-C 11/17/13 [phone]

## 2013-11-16 NOTE — Discharge Instructions (Signed)
Influenza, Adult Influenza ("the flu") is a viral infection of the respiratory tract. It occurs more often in winter months because people spend more time in close contact with one another. Influenza can make you feel very sick. Influenza easily spreads from person to person (contagious). CAUSES  Influenza is caused by a virus that infects the respiratory tract. You can catch the virus by breathing in droplets from an infected person's cough or sneeze. You can also catch the virus by touching something that was recently contaminated with the virus and then touching your mouth, nose, or eyes. SYMPTOMS  Symptoms typically last 4 to 10 days and may include:  Fever.  Chills.  Headache, body aches, and muscle aches.  Sore throat.  Chest discomfort and cough.  Poor appetite.  Weakness or feeling tired.  Dizziness.  Nausea or vomiting. DIAGNOSIS  Diagnosis of influenza is often made based on your history and a physical exam. A nose or throat swab test can be done to confirm the diagnosis. RISKS AND COMPLICATIONS You may be at risk for a more severe case of influenza if you smoke cigarettes, have diabetes, have chronic heart disease (such as heart failure) or lung disease (such as asthma), or if you have a weakened immune system. Elderly people and pregnant women are also at risk for more serious infections. The most common complication of influenza is a lung infection (pneumonia). Sometimes, this complication can require emergency medical care and may be life-threatening. PREVENTION  An annual influenza vaccination (flu shot) is the best way to avoid getting influenza. An annual flu shot is now routinely recommended for all adults in the [place] TREATMENT  In mild cases, influenza goes away on its own. Treatment is directed at relieving symptoms. For more severe cases, your caregiver may prescribe antiviral medicines to shorten the sickness. Antibiotic medicines are not effective, because the  infection is caused by a virus, not by bacteria. HOME CARE INSTRUCTIONS  Only take over-the-counter or prescription medicines for pain, discomfort, or fever as directed by your caregiver.  Use a cool mist humidifier to make breathing easier.  Get plenty of rest until your temperature returns to normal. This usually takes 3 to 4 days.  Drink enough fluids to keep your urine clear or pale yellow.  Cover your mouth and nose when coughing or sneezing, and wash your hands well to avoid spreading the virus.  Stay home from work or school until your fever has been gone for at least 1 full day. SEEK MEDICAL CARE IF:   You have chest pain or a deep cough that worsens or produces more mucus.  You have nausea, vomiting, or diarrhea. SEEK IMMEDIATE MEDICAL CARE IF:   You have difficulty breathing, shortness of breath, or your skin or nails turn bluish.  You have severe neck pain or stiffness.  You have a severe headache, facial pain, or earache.  You have a worsening or recurring fever.  You have nausea or vomiting that cannot be controlled. MAKE SURE YOU:  Understand these instructions.  Will watch your condition.  Will get help right away if you are not doing well or get worse. Document Released: 09/29/2000 Document Revised: 04/02/2012 Document Reviewed: 01/01/2012 [facility] Patient Information 2014 [place], [place].   Your symptoms are suggestive of the flu, which you are being treated for today.  Rest,  Make sure you are drinking plenty of fluids.  Take the entire course of the tamiflu.  The cough syrup may make you drowsy as this  contains a narcotic - do not drive within 4 hours of taking this medicine.  Get rechecked for any worsened symptoms.

## 2013-11-16 NOTE — ED Notes (Signed)
Pt seen and evaluated by EDPa for initial assessment. 

## 2013-11-16 NOTE — ED Notes (Signed)
Flu like sx with cough, nasal congestion and body aches x 2 days.

## 2013-11-19 NOTE — ED Provider Notes (Signed)
Medical screening examination/treatment/procedure(s) were performed by non-physician practitioner and as supervising physician I was immediately available for consultation/collaboration.  EKG Interpretation   None       Britny Riel, MD, FACEP   Akim Watkinson L Cristalle Rohm, MD 11/19/13 1612 

## 2013-12-03 ENCOUNTER — Emergency Department (HOSPITAL_COMMUNITY)
Admission: EM | Admit: 2013-12-03 | Discharge: 2013-12-03 | Disposition: A | Discharge status: Home / Self Care | Payer: Medicaid Other | Attending: Emergency Medicine | Admitting: Emergency Medicine

## 2013-12-03 ENCOUNTER — Encounter (HOSPITAL_COMMUNITY): Payer: Self-pay | Admitting: Emergency Medicine

## 2013-12-03 ENCOUNTER — Emergency Department (HOSPITAL_COMMUNITY)
Admission: EM | Admit: 2013-12-03 | Discharge: 2013-12-03 | Disposition: A | Discharge status: Home / Self Care | Payer: Medicaid Other | Source: Home / Self Care | Attending: Emergency Medicine | Admitting: Emergency Medicine

## 2013-12-03 ENCOUNTER — Emergency Department (HOSPITAL_COMMUNITY): Payer: Medicaid Other

## 2013-12-03 DIAGNOSIS — F17200 Nicotine dependence, unspecified, uncomplicated: Secondary | ICD-10-CM | POA: Insufficient documentation

## 2013-12-03 DIAGNOSIS — S63509A Unspecified sprain of unspecified wrist, initial encounter: Secondary | ICD-10-CM | POA: Insufficient documentation

## 2013-12-03 DIAGNOSIS — Y9289 Other specified places as the place of occurrence of the external cause: Secondary | ICD-10-CM | POA: Insufficient documentation

## 2013-12-03 DIAGNOSIS — Z8619 Personal history of other infectious and parasitic diseases: Secondary | ICD-10-CM | POA: Insufficient documentation

## 2013-12-03 DIAGNOSIS — W010XXA Fall on same level from slipping, tripping and stumbling without subsequent striking against object, initial encounter: Secondary | ICD-10-CM | POA: Insufficient documentation

## 2013-12-03 DIAGNOSIS — Z79899 Other long term (current) drug therapy: Secondary | ICD-10-CM | POA: Insufficient documentation

## 2013-12-03 DIAGNOSIS — J45909 Unspecified asthma, uncomplicated: Secondary | ICD-10-CM | POA: Insufficient documentation

## 2013-12-03 DIAGNOSIS — Y9389 Activity, other specified: Secondary | ICD-10-CM | POA: Insufficient documentation

## 2013-12-03 DIAGNOSIS — M25539 Pain in unspecified wrist: Secondary | ICD-10-CM

## 2013-12-03 MED ORDER — IBUPROFEN 800 MG PO TABS
800.0000 mg | ORAL_TABLET | Freq: Once | ORAL | Status: AC
  Administered 2013-12-03: 800 mg via ORAL
  Filled 2013-12-03: qty 1

## 2013-12-03 MED ORDER — IBUPROFEN 600 MG PO TABS: 600.0000 mg | ORAL_TABLET | Freq: Four times a day (QID) | ORAL | Status: DC | PRN

## 2013-12-03 MED ORDER — HYDROCODONE-ACETAMINOPHEN 5-325 MG PO TABS: 1.0000 | ORAL_TABLET | ORAL | Status: DC | PRN

## 2013-12-03 MED ORDER — HYDROCODONE-ACETAMINOPHEN 5-325 MG PO TABS: 1.0000 | ORAL_TABLET | Freq: Four times a day (QID) | ORAL | Status: DC | PRN

## 2013-12-03 MED ORDER — OXYCODONE-ACETAMINOPHEN 5-325 MG PO TABS
1.0000 | ORAL_TABLET | Freq: Once | ORAL | Status: AC
  Administered 2013-12-03: 1 via ORAL
  Filled 2013-12-03: qty 1

## 2013-12-03 MED ORDER — TRAMADOL HCL 50 MG PO TABS: 50.0000 mg | ORAL_TABLET | Freq: Four times a day (QID) | ORAL | Status: DC | PRN

## 2013-12-03 MED ORDER — IBUPROFEN 800 MG PO TABS: 800.0000 mg | ORAL_TABLET | Freq: Three times a day (TID) | ORAL | Status: DC

## 2013-12-03 NOTE — ED Notes (Addendum)
Pt states that he was outside and slipped on ice. Pt states that he landed on his left wrist. Pt states that he cannot move his left wrist. Pt has no swelling noted.

## 2013-12-03 NOTE — Discharge Instructions (Signed)
Joint Sprain °A sprain is a tear or stretch in the ligaments that hold a joint together. Severe sprains may need as long as 3-6 weeks of immobilization and/or exercises to heal completely. Sprained joints should be rested and protected. If not, they can become unstable and prone to re-injury. Proper treatment can reduce your pain, shorten the period of disability, and reduce the risk of repeated injuries. °TREATMENT  °· Rest and elevate the injured joint to reduce pain and swelling. °· Apply ice packs to the injury for 20-30 minutes every 2-3 hours for the next 2-3 days. °· Keep the injury wrapped in a compression bandage or splint as long as the joint is painful or as instructed by your caregiver. °· Do not use the injured joint until it is completely healed to prevent re-injury and chronic instability. Follow the instructions of your caregiver. °· Long-term sprain management may require exercises and/or treatment by a physical therapist. Taping or special braces may help stabilize the joint until it is completely better. °SEEK MEDICAL CARE IF:  °· You develop increased pain or swelling of the joint. °· You develop increasing redness and warmth of the joint. °· You develop a fever. °· It becomes stiff. °· Your hand or foot gets cold or numb. °Document Released: 11/09/2004 Document Revised: 12/25/2011 Document Reviewed: 10/19/2008 °[employer]® Patient Information ©2014 [employer]. ° °

## 2013-12-03 NOTE — Discharge Instructions (Signed)
Wrist Pain Wrist injuries are frequent in adults and children. A sprain is an injury to the ligaments that hold your bones together. A strain is an injury to muscle or muscle cord-like structures (tendons) from stretching or pulling. Generally, when wrists are moderately tender to touch following a fall or injury, a break in the bone (fracture) may be present. Most wrist sprains or strains are better in 3 to 5 days, but complete healing may take several weeks. HOME CARE INSTRUCTIONS   Put ice on the injured area.  Put ice in a plastic bag.  Place a towel between your skin and the bag.  Leave the ice on for 15-20 minutes, 03-04 times a day, for the first 2 days.  Keep your arm raised above the level of your heart whenever possible to reduce swelling and pain.  Rest the injured area for at least 48 hours or as directed by your caregiver.  If a splint or elastic bandage has been applied, use it for as long as directed by your caregiver or until seen by a caregiver for a follow-up exam.  Only take over-the-counter or prescription medicines for pain, discomfort, or fever as directed by your caregiver.  Keep all follow-up appointments. You may need to follow up with a specialist or have follow-up X-rays. Improvement in pain level is not a guarantee that you did not fracture a bone in your wrist. The only way to determine whether or not you have a broken bone is by X-ray. SEEK IMMEDIATE MEDICAL CARE IF:   Your fingers are swollen, very red, white, or cold and blue.  Your fingers are numb or tingling.  You have increasing pain.  You have difficulty moving your fingers. MAKE SURE YOU:   Understand these instructions.  Will watch your condition.  Will get help right away if you are not doing well or get worse. Document Released: 07/12/2005 Document Revised: 12/25/2011 Document Reviewed: 11/23/2010 [employer] Patient Information 2014 [employer].  

## 2013-12-03 NOTE — ED Notes (Addendum)
Pt reports left arm pain since a fall last night where he landed on his hand. Was told no fractures, placed wrist splint with sling. Reports he did not sleep last night because his left hand/arm hurt. Can wiggle fingers in left hand, sensation present.

## 2013-12-03 NOTE — ED Provider Notes (Signed)
CSN: [id number]     Arrival date & time 12/03/13  0132 History   First MD Initiated Contact with Patient 12/03/13 0142     Chief Complaint  Patient presents with  . Fall  . Wrist Injury     (Consider location/radiation/quality/duration/timing/severity/associated sxs/prior Treatment) HPI  Patient is a generally healthy 18 yo man who presents with left sided wrist pain after a FOOSH injury which resulted from a fall on icy surface. The injury occurred shortly pta.  The patient reports pain over the dorsal aspect of the left wrist which is worse with any attempt to extend the joint. Pain is aching, sometimes sharp, nonradiating and severe in nature. Patient denies associated paresthsias or motor weakness. Denies pain or injury in any other region.   Past Medical History  Diagnosis Date  . Herpes genitalis in men   . Asthma    Past Surgical History  Procedure Laterality Date  . Appendectomy    . Fracture surgery     Family History  Problem Relation Age of Onset  . Kidney failure Mother   . COPD Mother   . Hypertension Mother   . Cancer Other   . Asthma Other   . Heart failure Other    History  Substance Use Topics  . Smoking status: Current Every Day Smoker -- 0.50 packs/day    Types: Cigarettes  . Smokeless tobacco: Not on file  . Alcohol Use: No    Review of Systems  Ten point review of symptoms performed and is negative with the exception of symptoms noted above.    Allergies  Sulfa antibiotics  Home Medications   Current Outpatient Rx  Name  Route  Sig  Dispense  Refill  . ALPRAZolam (XANAX) 1 MG tablet   Oral   Take 1 mg by mouth daily as needed for anxiety.         . cloNIDine (CATAPRES) 0.3 MG tablet   Oral   Take 0.3 mg by mouth at bedtime.         Marland Kitchen. ibuprofen (ADVIL,MOTRIN) 200 MG tablet   Oral   Take 800 mg by mouth every 6 (six) hours as needed. pain         . sertraline (ZOLOFT) 50 MG tablet   Oral   Take 50 mg by mouth daily.          Marland Kitchen. ibuprofen (ADVIL,MOTRIN) 600 MG tablet   Oral   Take 1 tablet (600 mg total) by mouth every 6 (six) hours as needed.   30 tablet   0   . traMADol (ULTRAM) 50 MG tablet   Oral   Take 1 tablet (50 mg total) by mouth every 6 (six) hours as needed.   15 tablet   0    BP 143/56  Pulse 86  Temp(Src) 97.9 F (36.6 C) (Oral)  Resp 20  Wt 149 lb 4.8 oz (67.722 kg)  SpO2 99% Physical Exam Gen: well developed and well nourished appearing Head: NCAT Eyes: PERL, EOMI Nose: no epistaixis or rhinorrhea Mouth/throat: normal to inspection Neck: no c spine ttp.  Lungs: CTA B, no wheezing, rhonchi or rales CV: RRR, no murmur, extremities appear well perfused.  Abd: soft, notender, nondistended Back: no midline ttp Skin: warm and dry Ext:  normal to inspection, LUE: no deformity, normal to inspection, full passive ROM of the wrist without pain or resistance, mild ttp over the dorsal wrist, cap refill < 2s all fingers. Sensation intact to light touch throughout.  Neuro: CN ii-xii grossly intact, no focal deficits Psyche; normal affect,  calm and cooperative.   ED Course  Procedures (including critical care time) Labs Review Labs Reviewed - No data to display Imaging Review Dg Wrist Complete Left  12/03/2013   CLINICAL DATA:  Fall, wrist pain.  EXAM: LEFT WRIST - COMPLETE 3+ VIEW  COMPARISON:  DG WRIST COMPLETE*L* dated 03/21/2013  FINDINGS: There is no evidence of fracture or dislocation. There is no evidence of arthropathy or other focal bone abnormality. Soft tissues are unremarkable.  IMPRESSION: Negative.   Electronically Signed   By: Charlett Nose M.D.   On: 12/03/2013 02:06    MDM   Left wrist [place]. We have ruled out fracture and dislocation. We are treating acute pain in the ED and sill apply a volar wrist splint for comfort. Patient counseled re: ice, elevation, nsaid and prn tramadol. Referred to [facility] for outpatient f/u.     Brandt Loosen, MD 12/03/13 [phone]

## 2013-12-03 NOTE — ED Provider Notes (Signed)
CSN: [id number]     Arrival date & time 12/03/13  1515 History  This chart was scribed for non-physician practitioner, Arthor Captain, PA-C working with Merrie Roof,  by Luisa Dago, ED scribe. This patient was seen in room TR10C/TR10C and the patient's care was started at 5:40 PM.    Chief Complaint  Patient presents with  . Arm Pain     The history is provided by the patient. No language interpreter was used.   HPI Comments: Donald Marshall is a 18 y.o. male who presents to the Emergency Department complaining of left hand pain. Pt was seen Today in the ED for an injury that occurred after a fall on an icy surface. Pt is complaining of worsening pain and states that the medication that he was prescribed is not working. He states that he is allergic to Tramadol so he would like to be prescribed something different for pain control. Pt denies any fever, chills, or diaphoresis.    Past Medical History  Diagnosis Date  . Herpes genitalis in men   . Asthma    Past Surgical History  Procedure Laterality Date  . Appendectomy    . Fracture surgery     Family History  Problem Relation Age of Onset  . Kidney failure Mother   . COPD Mother   . Hypertension Mother   . Cancer Other   . Asthma Other   . Heart failure Other    History  Substance Use Topics  . Smoking status: Current Every Day Smoker -- 0.50 packs/day    Types: Cigarettes  . Smokeless tobacco: Not on file  . Alcohol Use: No    Review of Systems  Constitutional: Negative for fever, chills and diaphoresis.  Musculoskeletal: Positive for arthralgias (left hand pain).   Allergies  Sulfa antibiotics and Tramadol  Home Medications   Current Outpatient Rx  Name  Route  Sig  Dispense  Refill  . ALPRAZolam (XANAX) 1 MG tablet   Oral   Take 1 mg by mouth daily as needed for anxiety.         . cloNIDine (CATAPRES) 0.3 MG tablet   Oral   Take 0.3 mg by mouth at bedtime.         Marland Kitchen  HYDROcodone-acetaminophen (NORCO/VICODIN) 5-325 MG per tablet   Oral   Take 1 tablet by mouth every 4 (four) hours as needed.   4 tablet   0   . ibuprofen (ADVIL,MOTRIN) 600 MG tablet   Oral   Take 1 tablet (600 mg total) by mouth every 6 (six) hours as needed.   30 tablet   0   . ibuprofen (ADVIL,MOTRIN) 600 MG tablet   Oral   Take 600 mg by mouth every 6 (six) hours as needed for moderate pain.         Marland Kitchen sertraline (ZOLOFT) 50 MG tablet   Oral   Take 50 mg by mouth daily.          Triage vitals: BP 100/88  Pulse 88  Temp(Src) 98.1 F (36.7 C) (Oral)  Resp 18  SpO2 97%  Physical Exam  Nursing note and vitals reviewed. Constitutional: He is oriented to person, place, and time. He appears well-developed and well-nourished. No distress.  HENT:  Head: Normocephalic and atraumatic.  Eyes: EOM are normal.  Neck: Neck supple. No tracheal deviation present.  Cardiovascular: Normal rate.   Capillary refill is delayed in his left hand.   Pulmonary/Chest: Effort normal.  No respiratory distress.  Musculoskeletal: Normal range of motion.  Neurological: He is alert and oriented to person, place, and time.  Skin: Skin is warm and dry.  Psychiatric: He has a normal mood and affect. His behavior is normal.    ED Course  Procedures (including critical care time)  DIAGNOSTIC STUDIES: Oxygen Saturation is 97% on RA, adequate by my interpretation.    COORDINATION OF CARE: 5:44 PM- Will order pain medication for pt. Pt advised of plan for treatment and pt agrees.  Labs Review Labs Reviewed - No data to display Imaging Review Dg Wrist Complete Left  12/03/2013   CLINICAL DATA:  Fall, wrist pain.  EXAM: LEFT WRIST - COMPLETE 3+ VIEW  COMPARISON:  DG WRIST COMPLETE*L* dated 03/21/2013  FINDINGS: There is no evidence of fracture or dislocation. There is no evidence of arthropathy or other focal bone abnormality. Soft tissues are unremarkable.  IMPRESSION: Negative.   Electronically  Signed   By: Charlett NoseKevin  Dover M.D.   On: 12/03/2013 02:06    EKG Interpretation   None       MDM   Final diagnoses:  None     No signs of compartment syndrome. No snuffbox tenderness. Patient pain treated here in ED. Splint removed and patient placed in velcro wrist splint with more comfort,  6 Norco at discharge. F/u with ortho I personally performed the services described in this documentation, which was scribed in my presence. The recorded information has been reviewed and is accurate.      Arthor CaptainAbigail Shantanique Hodo, PA-C 12/08/13 [phone]

## 2013-12-08 NOTE — ED Provider Notes (Signed)
Medical screening examination/treatment/procedure(s) were performed by non-physician practitioner and as supervising physician I was immediately available for consultation/collaboration.  EKG Interpretation   None         Candyce ChurnJohn David Tiyona Desouza III, MD 12/08/13 2130

## 2014-04-13 ENCOUNTER — Emergency Department (HOSPITAL_COMMUNITY)
Admission: EM | Admit: 2014-04-13 | Discharge: 2014-04-13 | Disposition: A | Discharge status: Home / Self Care | Payer: Medicaid Other | Attending: Emergency Medicine | Admitting: Emergency Medicine

## 2014-04-13 ENCOUNTER — Encounter (HOSPITAL_COMMUNITY): Payer: Self-pay | Admitting: Emergency Medicine

## 2014-04-13 ENCOUNTER — Emergency Department (HOSPITAL_COMMUNITY): Payer: Medicaid Other

## 2014-04-13 DIAGNOSIS — Y9361 Activity, american tackle football: Secondary | ICD-10-CM | POA: Insufficient documentation

## 2014-04-13 DIAGNOSIS — Y9239 Other specified sports and athletic area as the place of occurrence of the external cause: Secondary | ICD-10-CM | POA: Insufficient documentation

## 2014-04-13 DIAGNOSIS — Z791 Long term (current) use of non-steroidal anti-inflammatories (NSAID): Secondary | ICD-10-CM | POA: Insufficient documentation

## 2014-04-13 DIAGNOSIS — Y92838 Other recreation area as the place of occurrence of the external cause: Secondary | ICD-10-CM

## 2014-04-13 DIAGNOSIS — W219XXA Striking against or struck by unspecified sports equipment, initial encounter: Secondary | ICD-10-CM | POA: Insufficient documentation

## 2014-04-13 DIAGNOSIS — F17200 Nicotine dependence, unspecified, uncomplicated: Secondary | ICD-10-CM | POA: Insufficient documentation

## 2014-04-13 DIAGNOSIS — J45909 Unspecified asthma, uncomplicated: Secondary | ICD-10-CM | POA: Insufficient documentation

## 2014-04-13 DIAGNOSIS — S63502A Unspecified sprain of left wrist, initial encounter: Secondary | ICD-10-CM

## 2014-04-13 DIAGNOSIS — S63509A Unspecified sprain of unspecified wrist, initial encounter: Secondary | ICD-10-CM | POA: Insufficient documentation

## 2014-04-13 DIAGNOSIS — Z8619 Personal history of other infectious and parasitic diseases: Secondary | ICD-10-CM | POA: Insufficient documentation

## 2014-04-13 DIAGNOSIS — Z79899 Other long term (current) drug therapy: Secondary | ICD-10-CM | POA: Insufficient documentation

## 2014-04-13 MED ORDER — OXYCODONE-ACETAMINOPHEN 5-325 MG PO TABS
1.0000 | ORAL_TABLET | Freq: Once | ORAL | Status: AC
  Administered 2014-04-13: 1 via ORAL
  Filled 2014-04-13: qty 1

## 2014-04-13 MED ORDER — OXYCODONE-ACETAMINOPHEN 5-325 MG PO TABS: 1.0000 | ORAL_TABLET | ORAL | Status: DC | PRN

## 2014-04-13 NOTE — ED Notes (Signed)
Pt injured his left wrist when his brother slammed him on the ground while they were playing football.

## 2014-04-13 NOTE — Discharge Instructions (Signed)
Your x-ray did not show a fracture. However, there are sometimes fractures in the wrist which did not show up well on initial x-ray. Wear the splint until you have a chance to be reevaluated by your orthopedic doctor.  Sprain A sprain is a tear in one of the strong, fibrous tissues that connect your bones (ligaments). The severity of the sprain depends on how much of the ligament is torn. The tear can be either partial or complete. CAUSES  Often, sprains are a result of a fall or an injury. The force of the impact causes the fibers of your ligament to stretch beyond their normal length. This excess tension causes the fibers of your ligament to tear. SYMPTOMS  You may have some loss of motion or increased pain within your normal range of motion. Other symptoms include:  Bruising.  Tenderness.  Swelling. DIAGNOSIS  In order to diagnose a sprain, your caregiver will physically examine you to determine how torn the ligament is. Your caregiver may also suggest an X-ray exam to make sure no bones are broken. TREATMENT  If your ligament is only partially torn, treatment usually involves keeping the injured area in a fixed position (immobilization) for a short period. To do this, your caregiver will apply a bandage, cast, or splint to keep the area from moving until it heals. For a partially torn ligament, the healing process usually takes 2 to 3 weeks. If your ligament is completely torn, you may need surgery to reconnect the ligament to the bone or to reconstruct the ligament. After surgery, a cast or splint may be applied and will need to stay on for 4 to 6 weeks while your ligament heals. HOME CARE INSTRUCTIONS  Keep the injured area elevated to decrease swelling.  To ease pain and swelling, apply ice to your joint twice a day, for 2 to 3 days.  Put ice in a plastic bag.  Place a towel between your skin and the bag.  Leave the ice on for 15 minutes.  Only take over-the-counter or  prescription medicine for pain as directed by your caregiver.  Do not leave the injured area unprotected until pain and stiffness go away (usually 3 to 4 weeks).  Do not allow your cast or splint to get wet. Cover your cast or splint with a plastic bag when you shower or bathe. Do not swim.  Your caregiver may suggest exercises for you to do during your recovery to prevent or limit permanent stiffness. SEEK IMMEDIATE MEDICAL CARE IF:  Your cast or splint becomes damaged.  Your pain becomes worse. MAKE SURE YOU:  Understand these instructions.  Will watch your condition.  Will get help right away if you are not doing well or get worse. Document Released: 09/29/2000 Document Revised: 12/25/2011 Document Reviewed: 10/14/2011 [facility] Patient Information 2015 [employer], [place]. This information is not intended to replace advice given to you by your health care provider. Make sure you discuss any questions you have with your health care provider.  Acetaminophen; Oxycodone tablets What is this medicine? ACETAMINOPHEN; OXYCODONE (a set a MEE noe fen; ox i KOE done) is a pain reliever. It is used to treat mild to moderate pain. This medicine may be used for other purposes; ask your health care provider or pharmacist if you have questions. COMMON BRAND NAME(S): Endocet, Magnacet, Narvox, Percocet, Perloxx, Primalev, Primlev, Roxicet, Xolox What should I tell my health care provider before I take this medicine? They need to know if you have any  of these conditions: -brain tumor -Crohn's disease, inflammatory bowel disease, or ulcerative colitis -drug abuse or addiction -head injury -heart or circulation problems -if you often drink alcohol -kidney disease or problems going to the bathroom -liver disease -lung disease, asthma, or breathing problems -an unusual or allergic reaction to acetaminophen, oxycodone, other opioid analgesics, other medicines, foods, dyes, or preservatives -pregnant  or trying to get pregnant -breast-feeding How should I use this medicine? Take this medicine by mouth with a full glass of water. Follow the directions on the prescription label. Take your medicine at regular intervals. Do not take your medicine more often than directed. Talk to your pediatrician regarding the use of this medicine in children. Special care may be needed. Patients over 71 years old may have a stronger reaction and need a smaller dose. Overdosage: If you think you have taken too much of this medicine contact a poison control center or emergency room at once. NOTE: This medicine is only for you. Do not share this medicine with others. What if I miss a dose? If you miss a dose, take it as soon as you can. If it is almost time for your next dose, take only that dose. Do not take double or extra doses. What may interact with this medicine? -alcohol -antihistamines -barbiturates like amobarbital, butalbital, butabarbital, methohexital, pentobarbital, phenobarbital, thiopental, and secobarbital -benztropine -drugs for bladder problems like solifenacin, trospium, oxybutynin, tolterodine, hyoscyamine, and methscopolamine -drugs for breathing problems like ipratropium and tiotropium -drugs for certain stomach or intestine problems like propantheline, homatropine methylbromide, glycopyrrolate, atropine, belladonna, and dicyclomine -general anesthetics like etomidate, ketamine, nitrous oxide, propofol, desflurane, enflurane, halothane, isoflurane, and sevoflurane -medicines for depression, anxiety, or psychotic disturbances -medicines for sleep -muscle relaxants -naltrexone -narcotic medicines (opiates) for pain -phenothiazines like perphenazine, thioridazine, chlorpromazine, mesoridazine, fluphenazine, prochlorperazine, promazine, and trifluoperazine -scopolamine -tramadol -trihexyphenidyl This list may not describe all possible interactions. Give your health care provider a list of  all the medicines, herbs, non-prescription drugs, or dietary supplements you use. Also tell them if you smoke, drink alcohol, or use illegal drugs. Some items may interact with your medicine. What should I watch for while using this medicine? Tell your doctor or health care professional if your pain does not go away, if it gets worse, or if you have new or a different type of pain. You may develop tolerance to the medicine. Tolerance means that you will need a higher dose of the medication for pain relief. Tolerance is normal and is expected if you take this medicine for a long time. Do not suddenly stop taking your medicine because you may develop a severe reaction. Your body becomes used to the medicine. This does NOT mean you are addicted. Addiction is a behavior related to getting and using a drug for a non-medical reason. If you have pain, you have a medical reason to take pain medicine. Your doctor will tell you how much medicine to take. If your doctor wants you to stop the medicine, the dose will be slowly lowered over time to avoid any side effects. You may get drowsy or dizzy. Do not drive, use machinery, or do anything that needs mental alertness until you know how this medicine affects you. Do not stand or sit up quickly, especially if you are an older patient. This reduces the risk of dizzy or fainting spells. Alcohol may interfere with the effect of this medicine. Avoid alcoholic drinks. There are different types of narcotic medicines (opiates) for pain. If you take  more than one type at the same time, you may have more side effects. Give your health care provider a list of all medicines you use. Your doctor will tell you how much medicine to take. Do not take more medicine than directed. Call emergency for help if you have problems breathing. The medicine will cause constipation. Try to have a bowel movement at least every 2 to 3 days. If you do not have a bowel movement for 3 days, call your  doctor or health care professional. Do not take Tylenol (acetaminophen) or medicines that have acetaminophen with this medicine. Too much acetaminophen can be very dangerous. Many nonprescription medicines contain acetaminophen. Always read the labels carefully to avoid taking more acetaminophen. What side effects may I notice from receiving this medicine? Side effects that you should report to your doctor or health care professional as soon as possible: -allergic reactions like skin rash, itching or hives, swelling of the face, lips, or tongue -breathing difficulties, wheezing -confusion -light headedness or fainting spells -severe stomach pain -unusually weak or tired -yellowing of the skin or the whites of the eyes Side effects that usually do not require medical attention (report to your doctor or health care professional if they continue or are bothersome): -dizziness -drowsiness -nausea -vomiting This list may not describe all possible side effects. Call your doctor for medical advice about side effects. You may report side effects to FDA at 1-800-FDA-1088. Where should I keep my medicine? Keep out of the reach of children. This medicine can be abused. Keep your medicine in a safe place to protect it from theft. Do not share this medicine with anyone. Selling or giving away this medicine is dangerous and against the law. Store at room temperature between 20 and 25 degrees C (68 and 77 degrees F). Keep container tightly closed. Protect from light. This medicine may cause accidental overdose and death if it is taken by other adults, children, or pets. Flush any unused medicine down the toilet to reduce the chance of harm. Do not use the medicine after the expiration date. NOTE: This sheet is a summary. It may not cover all possible information. If you have questions about this medicine, talk to your doctor, pharmacist, or health care provider.  2015, [employer]. (2013-05-26  13:17:35)

## 2014-04-13 NOTE — ED Provider Notes (Signed)
CSN: [id number]     Arrival date & time 04/13/14  0203 History   First MD Initiated Contact with Patient 04/13/14 [phone]     Chief complaint: Wrist injury  (Consider location/radiation/quality/duration/timing/severity/associated sxs/prior Treatment) The history is provided by the patient.   18 year old male states that he was playing football and he was slammed to the ground injuring his left wrist. Pain is severe and he rates it at 10/10. It is worse with palpation and movement. Nothing makes it better. He denies other injury.  Past Medical History  Diagnosis Date  . Herpes genitalis in men   . Asthma    Past Surgical History  Procedure Laterality Date  . Appendectomy    . Fracture surgery     Family History  Problem Relation Age of Onset  . Kidney failure Mother   . COPD Mother   . Hypertension Mother   . Cancer Other   . Asthma Other   . Heart failure Other    History  Substance Use Topics  . Smoking status: Current Every Day Smoker -- 0.50 packs/day    Types: Cigarettes  . Smokeless tobacco: Not on file  . Alcohol Use: No    Review of Systems  All other systems reviewed and are negative.     Allergies  Hydrocodone; Sulfa antibiotics; and Tramadol  Home Medications   Prior to Admission medications   Medication Sig Start Date End Date Taking? Authorizing Corran Lalone  ALPRAZolam Prudy Feeler(XANAX) 1 MG tablet Take 1 mg by mouth daily as needed for anxiety.   Yes Historical Venna Berberich, MD  ibuprofen (ADVIL,MOTRIN) 600 MG tablet Take 1 tablet (600 mg total) by mouth every 6 (six) hours as needed. 12/03/13  Yes Brandt LoosenJulie Manly, MD  ibuprofen (ADVIL,MOTRIN) 600 MG tablet Take 600 mg by mouth every 6 (six) hours as needed for moderate pain.   Yes Historical Akia Desroches, MD  sertraline (ZOLOFT) 50 MG tablet Take 50 mg by mouth daily.   Yes Historical Rivkah Wolz, MD  traZODone (DESYREL) 50 MG tablet Take 50 mg by mouth at bedtime.   Yes Historical Saphyra Hutt, MD  cloNIDine (CATAPRES) 0.3 MG tablet  Take 0.3 mg by mouth at bedtime.    Historical Denard Tuminello, MD  HYDROcodone-acetaminophen (NORCO) 5-325 MG per tablet Take 1 tablet by mouth every 6 (six) hours as needed. 12/03/13   Arthor CaptainAbigail Harris, PA-C  HYDROcodone-acetaminophen (NORCO/VICODIN) 5-325 MG per tablet Take 1 tablet by mouth every 4 (four) hours as needed. 12/03/13   Brandt LoosenJulie Manly, MD   BP 124/64  Pulse 87  Temp(Src) 98.3 F (36.8 C) (Oral)  Resp 20  Ht 5' 8.5" (1.74 m)  Wt 160 lb (72.576 kg)  BMI 23.97 kg/m2  SpO2 100% Physical Exam  Nursing note and vitals reviewed.  18 year old male, who appears uncomfortable, but is in no acute distress. Vital signs are normal. Oxygen saturation is 100%, which is normal. Head is normocephalic and atraumatic. PERRLA, EOMI. Oropharynx is clear. Neck is nontender and supple without adenopathy or JVD. Back is nontender and there is no CVA tenderness. Lungs are clear without rales, wheezes, or rhonchi. Chest is nontender. Heart has regular rate and rhythm without murmur. Abdomen is soft, flat, nontender without masses or hepatosplenomegaly and peristalsis is normoactive. Extremities: There is mild swelling around the left wrist with marked tenderness to palpation virtually anywhere. No point tenderness is identified, but there is pain with axial loading of the thumb. There is pain with any passive movement. Distal neurovascular exam is intact  with prompt capillary refill and normal sensation. No other extremity injuries seen.. Skin is warm and dry without rash. Neurologic: Mental status is normal, cranial nerves are intact, there are no motor or sensory deficits.  ED Course  Procedures (including critical care time) SPLINT APPLICATION Date/Time: 4:09 AM Authorized by: ZOXWR,UEAVWGLICK,DAVID Consent: Verbal consent obtained. Risks and benefits: risks, benefits and alternatives were discussed Consent given by: patient Splint applied by: orthopedic technician Location details: left wrist Splint type:  thumb spica Supplies used: pre-made thumb spica splint Post-procedure: The splinted body part was neurovascularly unchanged following the procedure. Patient tolerance: Patient tolerated the procedure well with no immediate complications.     Imaging Review Dg Wrist Complete Left  04/13/2014   CLINICAL DATA:  Left wrist pain after injury.  EXAM: LEFT WRIST - COMPLETE 3+ VIEW  COMPARISON:  12/03/2013  FINDINGS: No malalignment or suspected fracture. Mild cortical irregularity at the level of the dorsal radial metaphysis is likely a physeal remnant, newly seen secondary to differences in projection. Focused examination could exclude in the point tenderness at this location.  IMPRESSION: No suspected fracture.  See discussion above.   Electronically Signed   By: Tiburcio PeaJonathan  Watts M.D.   On: 04/13/2014 03:52   Images viewed by me.  MDM   Final diagnoses:  Sprain of left wrist, initial encounter    Right wrist injury. X-rays have been ordered.  X-rays are negative for fracture. He is placed in a thumb spica splint to treat possible occult scaphoid fracture and is discharged with prescription for oxycodone and acetaminophen. He has an established orthopedic physician-Dr. Chaney MallingMortenson. He is to follow up with him in the next 3 days.  Dione Boozeavid Glick, MD 04/13/14 [phone]

## 2014-05-18 ENCOUNTER — Encounter (HOSPITAL_COMMUNITY): Payer: Self-pay | Admitting: Emergency Medicine

## 2014-05-18 ENCOUNTER — Emergency Department (HOSPITAL_COMMUNITY)
Admission: EM | Admit: 2014-05-18 | Discharge: 2014-05-18 | Disposition: A | Discharge status: Home / Self Care | Payer: Medicaid Other | Attending: Emergency Medicine | Admitting: Emergency Medicine

## 2014-05-18 DIAGNOSIS — K029 Dental caries, unspecified: Secondary | ICD-10-CM | POA: Diagnosis not present

## 2014-05-18 DIAGNOSIS — J45909 Unspecified asthma, uncomplicated: Secondary | ICD-10-CM | POA: Insufficient documentation

## 2014-05-18 DIAGNOSIS — Z8619 Personal history of other infectious and parasitic diseases: Secondary | ICD-10-CM | POA: Diagnosis not present

## 2014-05-18 DIAGNOSIS — K0889 Other specified disorders of teeth and supporting structures: Secondary | ICD-10-CM

## 2014-05-18 DIAGNOSIS — Z79899 Other long term (current) drug therapy: Secondary | ICD-10-CM | POA: Insufficient documentation

## 2014-05-18 DIAGNOSIS — F17200 Nicotine dependence, unspecified, uncomplicated: Secondary | ICD-10-CM | POA: Insufficient documentation

## 2014-05-18 DIAGNOSIS — K089 Disorder of teeth and supporting structures, unspecified: Secondary | ICD-10-CM | POA: Diagnosis not present

## 2014-05-18 MED ORDER — PENICILLIN V POTASSIUM 500 MG PO TABS: 500.0000 mg | ORAL_TABLET | Freq: Four times a day (QID) | ORAL | Status: AC

## 2014-05-18 MED ORDER — IBUPROFEN 800 MG PO TABS: 800.0000 mg | ORAL_TABLET | Freq: Three times a day (TID) | ORAL | Status: DC

## 2014-05-18 NOTE — Discharge Instructions (Signed)

## 2014-05-18 NOTE — ED Provider Notes (Signed)
CSN: [id number]     Arrival date & time 05/18/14  1054 History  This chart was scribed for non-physician practitioner, Pauline Aus, PA-C,working with Shon Baton, MD, by Karle Plumber, ED Scribe. This patient was seen in room APFT22/APFT22 and the patient's care was started at 11:37 AM.  Chief Complaint  Patient presents with  . Dental Pain   The history is provided by the patient. No language interpreter was used.   HPI Comments:  Donald Marshall is a 18 y.o. male who presents to the Emergency Department complaining of severe dental pain secondary to having 5 teeth pulled five days ago. He reports having all his wisdom teeth and a top upper teeth removed. He states Dr. Jarold Motto was the dentist that performed the procedure. He states he called them and cannot be worked in until 8 days away. Pt reports being prescribed pain medication, filled at [facility], and an antibiotic that he cannot recall the name of or where it was filled, but states he has been taking as prescribed. He reports the top upper area hurts more now than it did immediately after the procedure. He denies bleeding, drainage, facial swelling or fever. Pt was sleeping upon entering room for exam and had to be woken.  Past Medical History  Diagnosis Date  . Herpes genitalis in men   . Asthma    Past Surgical History  Procedure Laterality Date  . Appendectomy    . Fracture surgery     Family History  Problem Relation Age of Onset  . Kidney failure Mother   . COPD Mother   . Hypertension Mother   . Cancer Other   . Asthma Other   . Heart failure Other    History  Substance Use Topics  . Smoking status: Current Every Day Smoker -- 0.50 packs/day    Types: Cigarettes  . Smokeless tobacco: Not on file  . Alcohol Use: No    Review of Systems  Constitutional: Negative for fever and appetite change.  HENT: Positive for dental problem. Negative for congestion, facial swelling, sore throat and trouble  swallowing.   Eyes: Negative for pain and visual disturbance.  Musculoskeletal: Negative for neck pain and neck stiffness.  Neurological: Negative for dizziness, facial asymmetry and headaches.  Hematological: Negative for adenopathy.  All other systems reviewed and are negative.   Allergies  Hydrocodone; Sulfa antibiotics; and Tramadol  Home Medications   Prior to Admission medications   Medication Sig Start Date End Date Taking? Authorizing Provider  ibuprofen (ADVIL,MOTRIN) 800 MG tablet Take 800 mg by mouth every 6 (six) hours as needed for moderate pain.   Yes Historical Provider, MD  sertraline (ZOLOFT) 50 MG tablet Take 50 mg by mouth daily.   Yes Historical Provider, MD  traZODone (DESYREL) 50 MG tablet Take 50 mg by mouth at bedtime.   Yes Historical Provider, MD   Triage Vitals: BP 140/83  Pulse 100  Temp(Src) 99.1 F (37.3 C) (Oral)  Resp 18  Ht 5\' 8"  (1.727 m)  Wt 150 lb (68.04 kg)  BMI 22.81 kg/m2  SpO2 100% Physical Exam  Nursing note and vitals reviewed. Constitutional: He is oriented to person, place, and time. He appears well-developed and well-nourished.  HENT:  Head: Normocephalic and atraumatic.  Mouth/Throat: Uvula is midline, oropharynx is clear and moist and mucous membranes are normal. No trismus in the jaw. Dental caries present. No dental abscesses or uvula swelling.    Widespread dental decay with recent extractions  of the bilateral upper central and lateral incisors. Mild erythema of the gums. No fluctuance or obvious dry socket.  Eyes: EOM are normal. Pupils are equal, round, and reactive to light.  Neck: Normal range of motion.  Cardiovascular: Normal rate, regular rhythm and normal heart sounds.  Exam reveals no gallop and no friction rub.   No murmur heard. Pulmonary/Chest: Effort normal and breath sounds normal. No respiratory distress. He has no wheezes. He has no rales.  Musculoskeletal: Normal range of motion.  Lymphadenopathy:    He has  no cervical adenopathy.  Neurological: He is alert and oriented to person, place, and time.  Skin: Skin is warm and dry.  Psychiatric: He has a normal mood and affect. His behavior is normal.    ED Course  Procedures (including critical care time) DIAGNOSTIC STUDIES: Oxygen Saturation is 100% on RA, normal by my interpretation.   COORDINATION OF CARE: 11:40 AM- Will prescribe pain medication. Pt verbalizes understanding and agrees to plan.  Medications - No data to display  Labs Review Labs Reviewed - No data to display  Imaging Review No results found.   EKG Interpretation None      MDM   Final diagnoses:  Pain, dental    Pt is well appearing, VSS.  No concerning sx';s for infection to the floor of the mouth or deep structures of the neck.  Advised to f/u with his dentist.  Appears stable for d/c.    I personally performed the services described in this documentation, which was scribed in my presence. The recorded information has been reviewed and is accurate.    Sharlisa Hollifield L. Trisha Mangleriplett, PA-C 05/20/14 1615

## 2014-05-18 NOTE — ED Notes (Signed)
Pt had 5 teeth pulled last Wednesday,  1 wisdom tooth and upper front teeth.  Pt says he is on an antibiotic , can't remember name. Has appt with oral surgeon next week but cannot stand the pain, Advised by oral surgeon to come to ER.  Out of pain meds

## 2014-05-18 NOTE — ED Notes (Signed)
Toothache for one week to top upper.  Rates pain 10.  Tool 800 mg ibuprofen and tylenol.

## 2014-05-21 NOTE — ED Provider Notes (Signed)
Medical screening examination/treatment/procedure(s) were performed by non-physician practitioner and as supervising physician I was immediately available for consultation/collaboration.   EKG Interpretation None        Nhyira Leano F Bryn Perkin, MD 05/21/14 1759 

## 2014-09-07 IMAGING — CR DG CHEST 2V
2 series · 2 of 2 positions shown · non-contrast
Comparison: Chest x-ray 06/16/2008.

CLINICAL DATA: Cough and congestion.

CHEST - 2 VIEW

[view not recorded (1 of 2)]
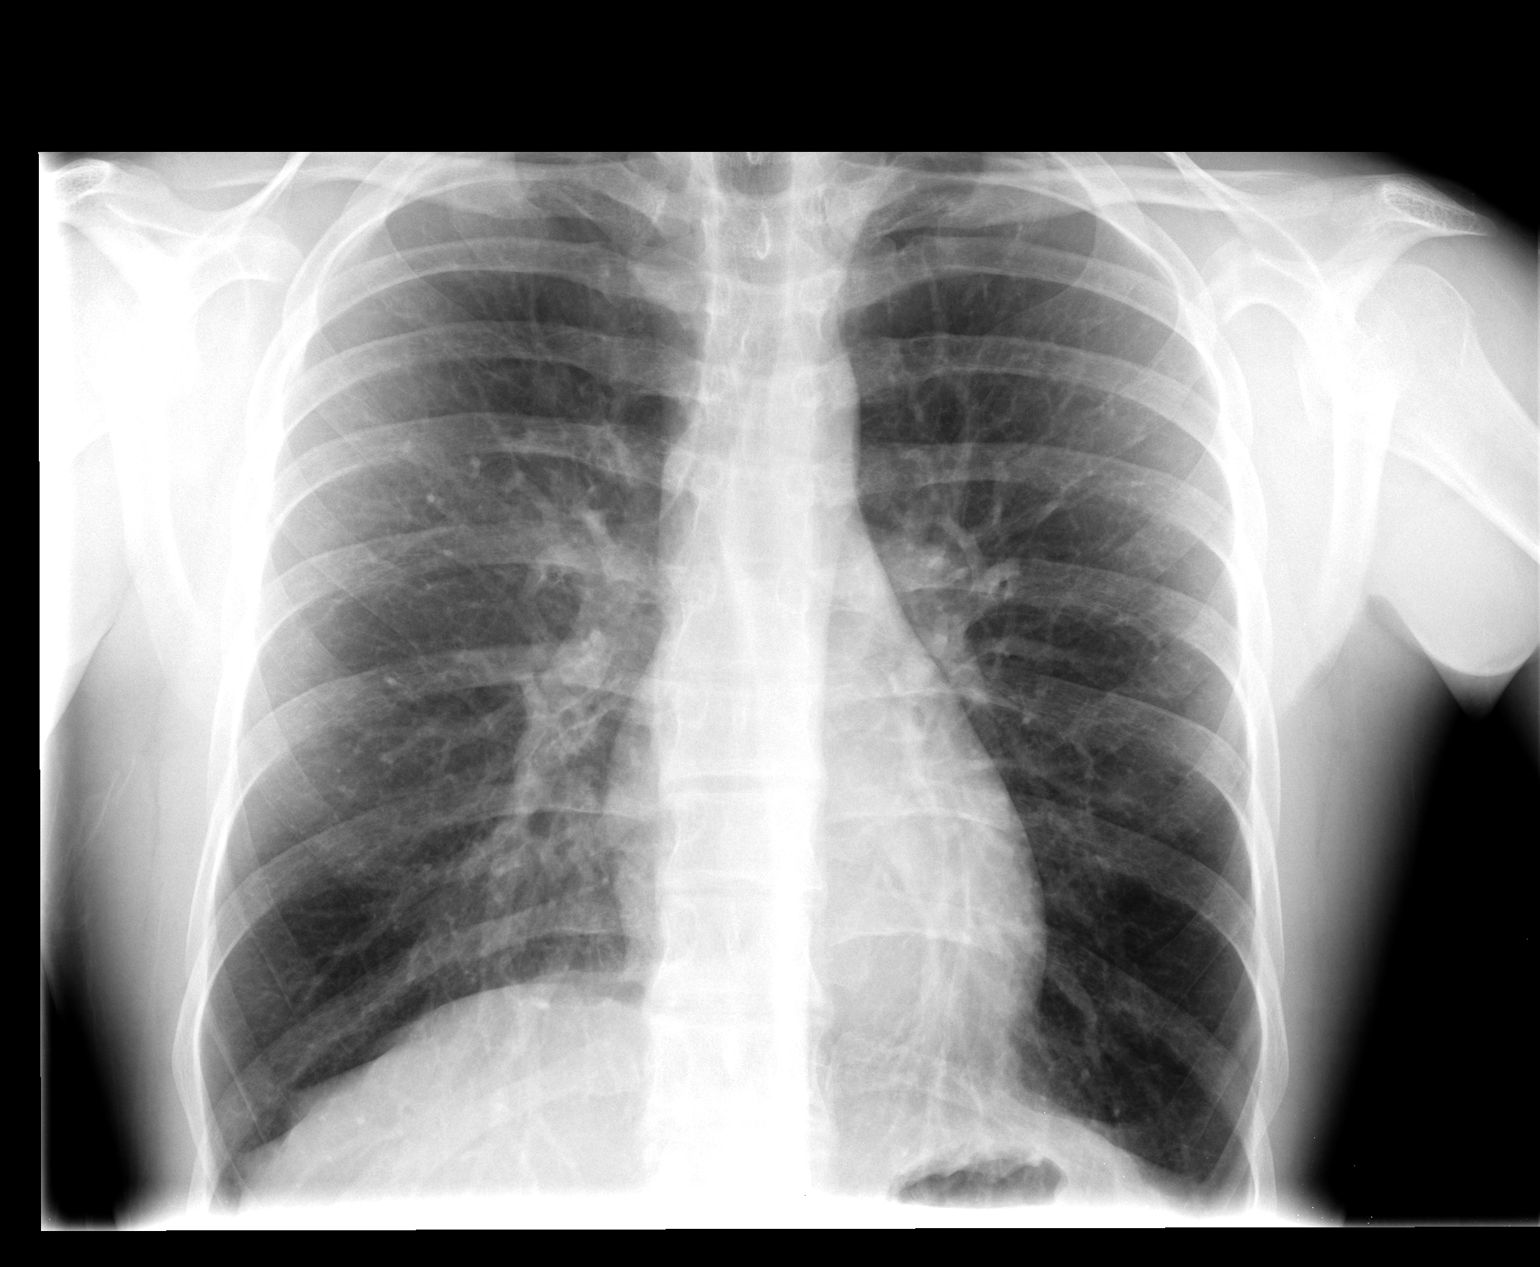

[view not recorded (2 of 2)]
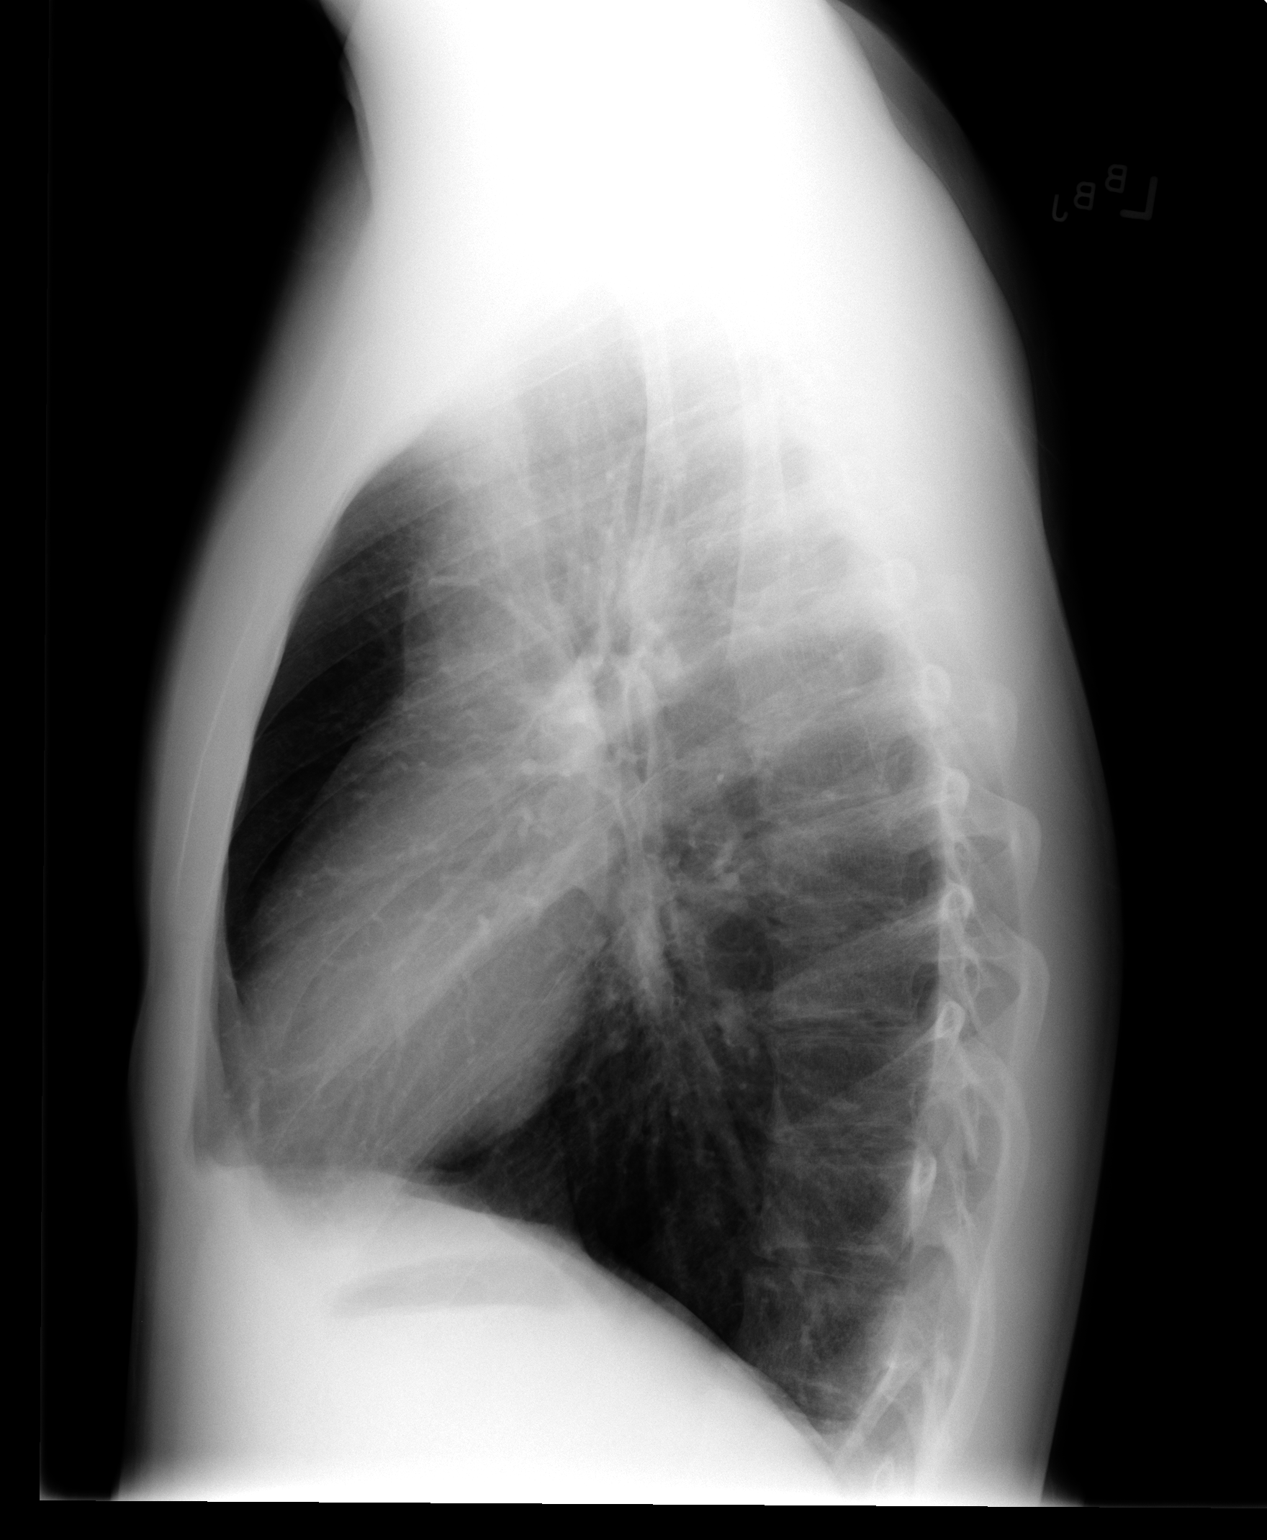

[2 of 2 positions shown; findings below may reference images not displayed]

FINDINGS: Lung volumes are normal.  No consolidative airspace
disease.  No pleural effusions.  No pneumothorax.  No pulmonary
nodule or mass noted.  Pulmonary vasculature and the
cardiomediastinal silhouette are within normal limits.
IMPRESSION: 1. No radiographic evidence of acute cardiopulmonary disease.

## 2014-12-08 IMAGING — CR DG HAND COMPLETE 3+V*R*
3 series · 3 of 3 positions shown · non-contrast
Comparison: 03/21/2013

CLINICAL DATA: Right hand pain, injury. Recent internal fixation.

EXAM:
RIGHT HAND - COMPLETE 3+ VIEW

[view not recorded (1 of 3)]
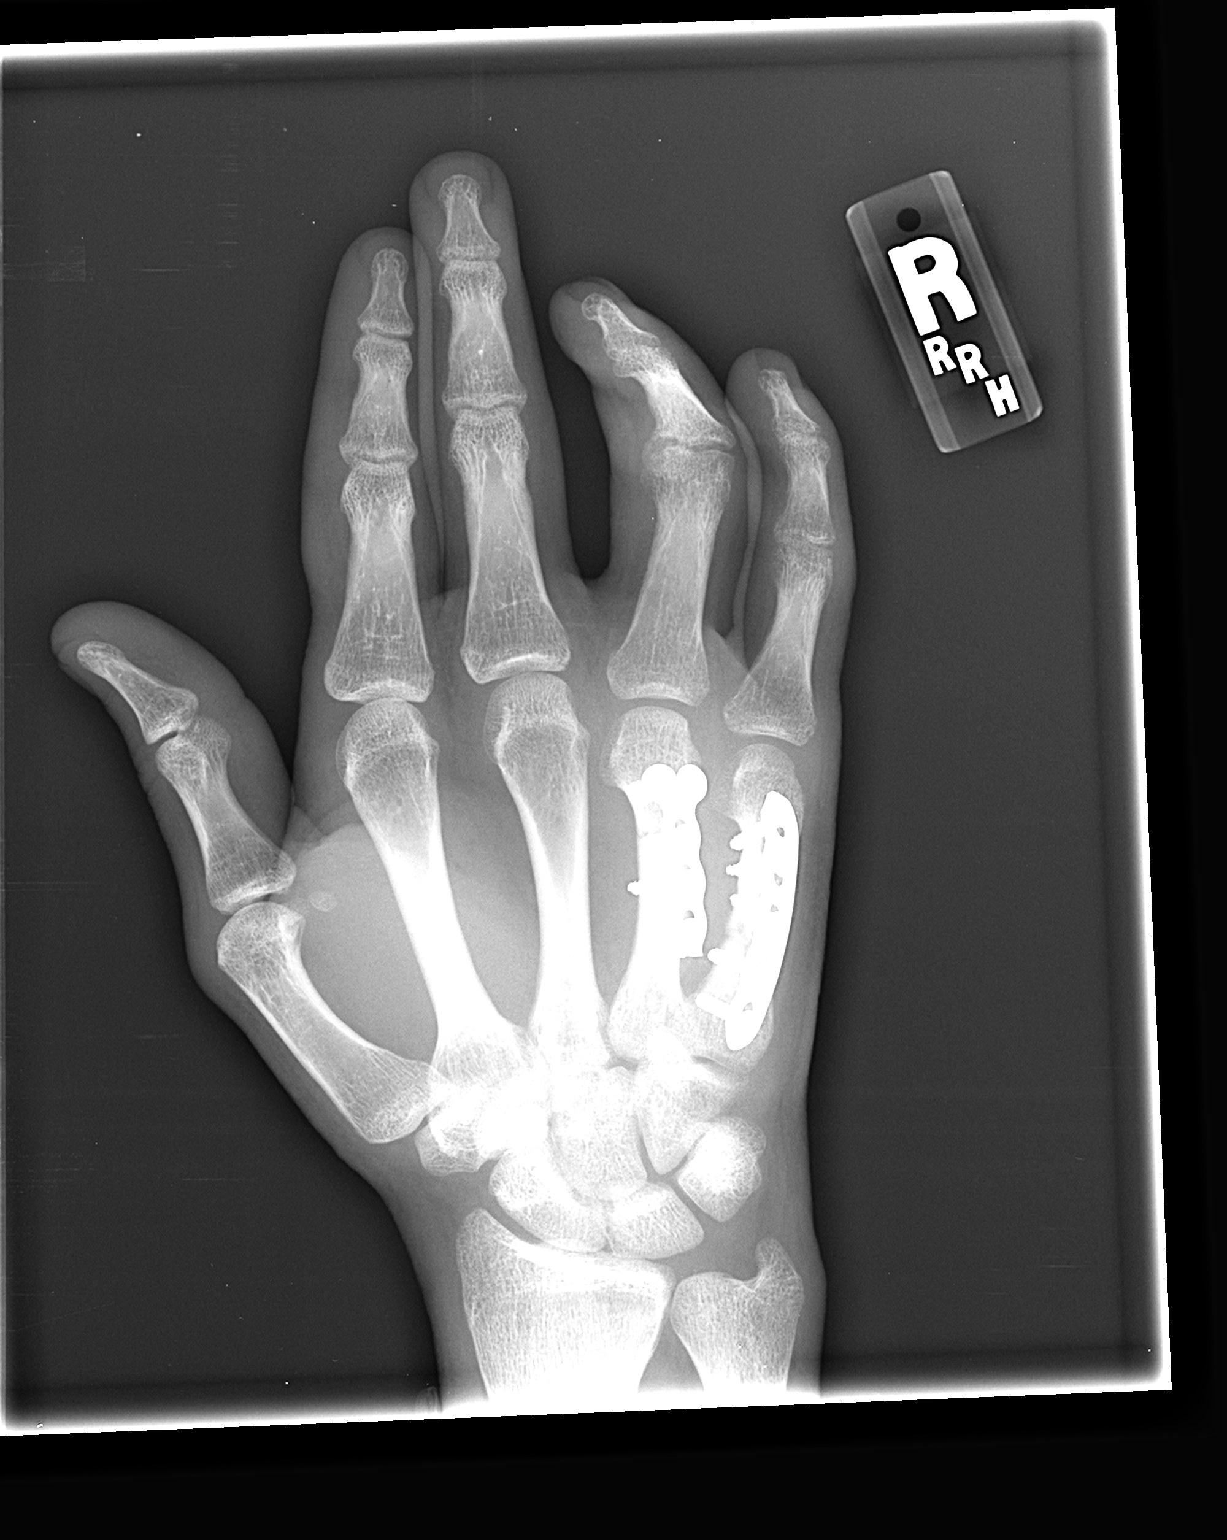

[view not recorded (2 of 3)]
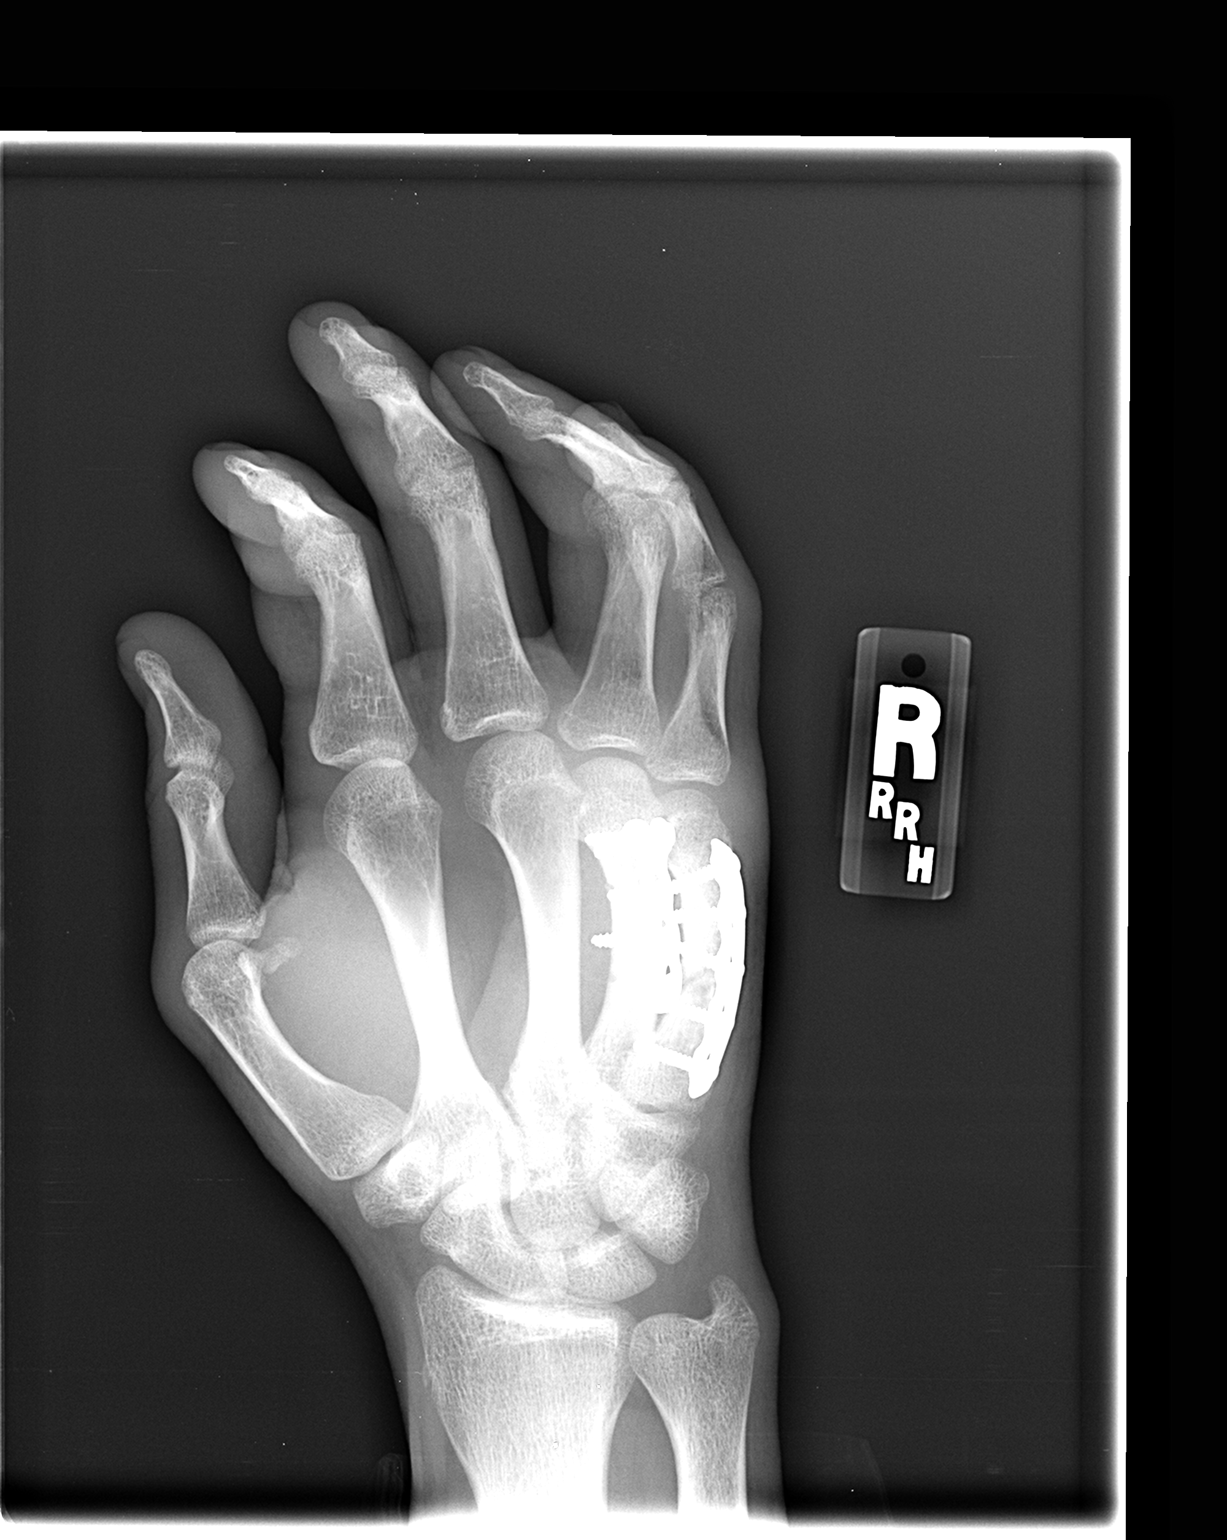

[view not recorded (3 of 3)]
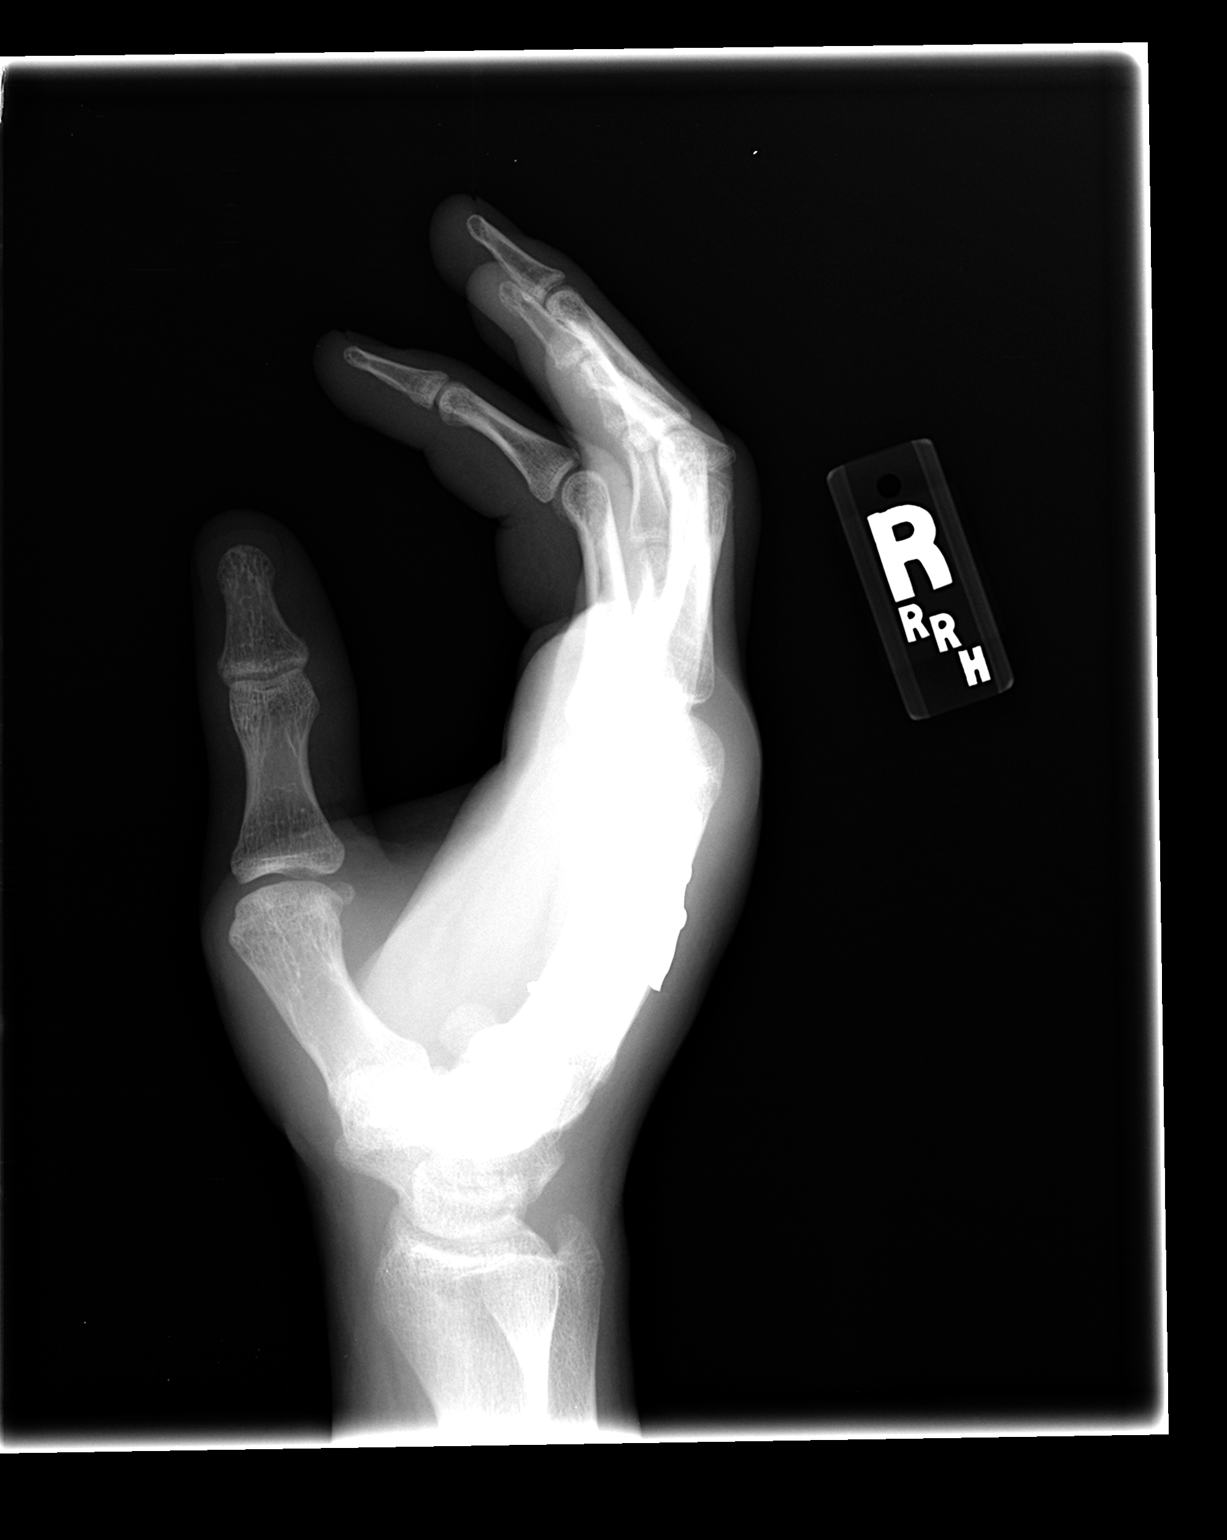

[3 of 3 positions shown; findings below may reference images not displayed]

FINDINGS: There is posterior soft tissue swelling. Plate and screw fixation
devices are noted in the 4th and 5th metacarpals. No hardware or
bony complicating feature. Fracture through the 5th metacarpal is
visualized. Cannot visualize the 4th metacarpal fracture these
images.
IMPRESSION: Plate and screw fixation within the right 4th and 5th metacarpals.

## 2022-10-21 ENCOUNTER — Emergency Department (HOSPITAL_COMMUNITY)
Admission: EM | Admit: 2022-10-21 | Discharge: 2022-10-21 | Disposition: A | Discharge status: Home / Self Care | Payer: Medicaid Other | Attending: Emergency Medicine | Admitting: Emergency Medicine

## 2022-10-21 ENCOUNTER — Encounter (HOSPITAL_COMMUNITY): Payer: Self-pay

## 2022-10-21 ENCOUNTER — Other Ambulatory Visit: Payer: Self-pay

## 2022-10-21 DIAGNOSIS — R21 Rash and other nonspecific skin eruption: Secondary | ICD-10-CM | POA: Diagnosis present

## 2022-10-21 DIAGNOSIS — L739 Follicular disorder, unspecified: Secondary | ICD-10-CM | POA: Insufficient documentation

## 2022-10-21 LAB — URINALYSIS, ROUTINE W REFLEX MICROSCOPIC
Bilirubin Urine: NEGATIVE
Glucose, UA: NEGATIVE mg/dL
Hgb urine dipstick: NEGATIVE
Ketones, ur: 20 mg/dL — AB
Leukocytes,Ua: NEGATIVE
Nitrite: NEGATIVE
Protein, ur: NEGATIVE mg/dL
Specific Gravity, Urine: 1.015 (ref 1.005–1.030)
pH: 6 (ref 5.0–8.0)

## 2022-10-21 MED ORDER — OXYCODONE-ACETAMINOPHEN 5-325 MG PO TABS
1.0000 | ORAL_TABLET | Freq: Once | ORAL | Status: AC
  Administered 2022-10-21: 1 via ORAL
  Filled 2022-10-21: qty 1

## 2022-10-21 MED ORDER — OXYCODONE-ACETAMINOPHEN 5-325 MG PO TABS: 1.0000 | ORAL_TABLET | ORAL | 0 refills | Status: DC | PRN

## 2022-10-21 MED ORDER — DOXYCYCLINE HYCLATE 100 MG PO TABS
100.0000 mg | ORAL_TABLET | Freq: Once | ORAL | Status: AC
  Administered 2022-10-21: 100 mg via ORAL
  Filled 2022-10-21: qty 1

## 2022-10-21 MED ORDER — CLOTRIMAZOLE 1 % EX CREA: TOPICAL_CREAM | CUTANEOUS | 0 refills | Status: DC

## 2022-10-21 MED ORDER — DOXYCYCLINE HYCLATE 100 MG PO CAPS: 100.0000 mg | ORAL_CAPSULE | Freq: Two times a day (BID) | ORAL | 0 refills | Status: DC

## 2022-10-21 NOTE — ED Triage Notes (Signed)
Pt reports a swollen painful area to the right groin x 3 days that he believes may be a spider bite.

## 2022-10-21 NOTE — Discharge Instructions (Addendum)
Stop using hydrogen peroxide on the area.  Clean with mild soap and water and then dry thoroughly using the cool setting on a hair dryer.  Apply the cream as directed and take the antibiotic as directed until finished.  Follow-up with your primary care provider or return to the ER for any new or worsening symptoms.

## 2022-10-22 ENCOUNTER — Telehealth (HOSPITAL_COMMUNITY): Payer: Self-pay | Admitting: Emergency Medicine

## 2022-10-22 MED ORDER — CLOTRIMAZOLE 1 % EX CREA: TOPICAL_CREAM | CUTANEOUS | 0 refills | Status: DC

## 2022-10-22 MED ORDER — IBUPROFEN 800 MG PO TABS: 800.0000 mg | ORAL_TABLET | Freq: Three times a day (TID) | ORAL | 0 refills | Status: DC

## 2022-10-22 MED ORDER — DOXYCYCLINE HYCLATE 100 MG PO CAPS: 100.0000 mg | ORAL_CAPSULE | Freq: Two times a day (BID) | ORAL | 0 refills | Status: DC

## 2022-10-22 NOTE — Telephone Encounter (Signed)
Patient called requesting medications be sent to another pharmacy.  Medications electronically sent to patient's pharmacy of choice.  Original pharmacy contacted and prescriptions were canceled

## 2022-10-23 ENCOUNTER — Telehealth (HOSPITAL_COMMUNITY): Payer: Self-pay | Admitting: Emergency Medicine

## 2022-10-23 MED ORDER — OXYCODONE-ACETAMINOPHEN 5-325 MG PO TABS: 1.0000 | ORAL_TABLET | ORAL | 0 refills | Status: DC | PRN

## 2022-10-23 NOTE — Telephone Encounter (Signed)
Donald Marshall did not have his pain medicine.  Will transfer to [name]

## 2022-10-23 NOTE — ED Provider Notes (Signed)
[facility] EMERGENCY DEPARTMENT Provider Note   CSN: [id number] Arrival date & time: 10/21/22  1655     History  Chief Complaint  Patient presents with   Abscess    Donald Marshall is a 27 y.o. male.   Abscess Associated symptoms: no fever, no nausea and no vomiting        Donald Marshall is a 27 y.o. male who presents to the Emergency Department complaining of painful rash, "sore" to his right groin x 3 days.  He recalls having a small pimple to the area that he scratched and now has a weeping open sore that is painful to touch, and burning stinging sensation.  He believes that he was bitten by a spider.  He denies difficulty urinating, fever, chills, abdominal pain, and pain to his testicles.  Home Medications Prior to Admission medications   Medication Sig Start Date End Date Taking? Authorizing Provider  clotrimazole (LOTRIMIN) 1 % cream Apply to affected area 2 times daily 10/22/22   Breanne Olvera, PA-C  doxycycline (VIBRAMYCIN) 100 MG capsule Take 1 capsule (100 mg total) by mouth 2 (two) times daily. 10/22/22   Braxtyn Dorff, PA-C  ibuprofen (ADVIL) 800 MG tablet Take 1 tablet (800 mg total) by mouth 3 (three) times daily. Take with food 10/22/22   Frisco Cordts, PA-C  ibuprofen (ADVIL,MOTRIN) 800 MG tablet Take 1 tablet (800 mg total) by mouth 3 (three) times daily. 05/18/14   Fransico Sciandra, PA-C  oxyCODONE-acetaminophen (PERCOCET/ROXICET) 5-325 MG tablet Take 1 tablet by mouth every 4 (four) hours as needed. 10/23/22   Benjiman Core, MD  sertraline (ZOLOFT) 50 MG tablet Take 50 mg by mouth daily.    [provider]  traZODone (DESYREL) 50 MG tablet Take 50 mg by mouth at bedtime.    [provider]      Allergies    Hydrocodone, Sulfa antibiotics, and Tramadol    Review of Systems   Review of Systems  Constitutional:  Negative for chills and fever.  Respiratory:  Negative for shortness of breath.   Gastrointestinal:  Negative for abdominal  pain, nausea and vomiting.  Genitourinary:  Negative for decreased urine volume, dysuria, penile discharge, penile pain, scrotal swelling and testicular pain.       Rash/lesion to right groin  Musculoskeletal:  Negative for arthralgias and back pain.    Physical Exam Updated Vital Signs BP 127/75 (BP Location: Left Arm)   Pulse 99   Temp 98 F (36.7 C) (Oral)   Resp 16   Ht 5\' 9"  (1.753 m)   Wt 90.7 kg   SpO2 100%   BMI 29.53 kg/m  Physical Exam Vitals and nursing note reviewed.  Constitutional:      General: He is not in acute distress.    Appearance: Normal appearance. He is not toxic-appearing.  Cardiovascular:     Rate and Rhythm: Normal rate and regular rhythm.     Pulses: Normal pulses.  Pulmonary:     Effort: Pulmonary effort is normal.  Abdominal:     Palpations: Abdomen is soft.     Tenderness: There is no abdominal tenderness.  Musculoskeletal:        General: Normal range of motion.  Skin:    General: Skin is warm.     Comments: 3 cm Focal area of serous and slightly purulent drainage to right mons.  Area appears de roofed.  No surrounding erythema.  Few satellite pustules to the pubic region.  No vesicles.  No lymphadenopathy of the groin  Neurological:     Mental Status: He is alert.     ED Results / Procedures / Treatments   Labs (all labs ordered are listed, but only abnormal results are displayed) Labs Reviewed  URINALYSIS, ROUTINE W REFLEX MICROSCOPIC - Abnormal; Notable for the following components:      Result Value   Ketones, ur 20 (*)    All other components within normal limits    EKG None  Radiology No results found.  Procedures Procedures    Medications Ordered in ED Medications  oxyCODONE-acetaminophen (PERCOCET/ROXICET) 5-325 MG per tablet 1 tablet (1 tablet Oral Given 10/21/22 1901)  doxycycline (VIBRA-TABS) tablet 100 mg (100 mg Oral Given 10/21/22 1902)    ED Course/ Medical Decision Making/ A&P                            Medical Decision Making Patient here for patient of painful rash/lesion of the right groin.  Concerned that he has a spider bite.  He has squeezed the area and now has serous and slightly purulent drainage of the area.  No abdominal tenderness penile symptoms or tenderness of the scrotum or testicles.  Differential would include but not limited to abscess, folliculitis, herpes, chancre.  There is no induration or fluctuance to the area to suggest abscess.  No vesicles to indicate herpes.  There are several smaller satellite pustules within the area mons pubis.  I suspect this is folliculitis, there may be a secondary cellulitis and slight odor to the area that is suggestive of yeast.  No history of diabetes.  Amount and/or Complexity of Data Reviewed Labs: ordered.    Details: Urine without evidence of infection Discussion of management or test interpretation with external provider(s): Patient here with likely cellulitis there may also be a component of yeast.  No abscess at this time  Patient agreeable to treatment plan as discussed.  Prescription written for short course of pain medication, antifungal and doxycycline.  Risk Prescription drug management.           Final Clinical Impression(s) / ED Diagnoses Final diagnoses:  Folliculitis    Rx / DC Orders ED Discharge Orders          Ordered    doxycycline (VIBRAMYCIN) 100 MG capsule  2 times daily,   Status:  Discontinued        10/21/22 1938    clotrimazole (LOTRIMIN) 1 % cream  Status:  Discontinued        10/21/22 1938    oxyCODONE-acetaminophen (PERCOCET/ROXICET) 5-325 MG tablet  Every 4 hours PRN,   Status:  Discontinued        10/21/22 1938              Kem Parkinson, PA-C 10/23/22 1845    Milton Ferguson, MD 10/25/22 [phone]

## 2022-10-24 ENCOUNTER — Telehealth (HOSPITAL_COMMUNITY): Payer: Self-pay | Admitting: Student

## 2022-10-24 MED ORDER — OXYCODONE-ACETAMINOPHEN 5-325 MG PO TABS: 1.0000 | ORAL_TABLET | ORAL | 0 refills | Status: DC | PRN

## 2022-10-24 NOTE — Telephone Encounter (Signed)
Note created to send opioids to pharmacy that has it in stock

## 2022-10-24 NOTE — ED Notes (Signed)
Pt requested percocet to be changed to [place] due to his pharmacy is out of stock. EDP aware and changed the rx to [facility] as requested. Pt aware. 10/24/2022 @1230 

## 2022-11-16 DIAGNOSIS — Z419 Encounter for procedure for purposes other than remedying health state, unspecified: Secondary | ICD-10-CM | POA: Diagnosis not present

## 2022-12-06 DIAGNOSIS — F419 Anxiety disorder, unspecified: Secondary | ICD-10-CM | POA: Diagnosis not present

## 2022-12-06 DIAGNOSIS — F32A Depression, unspecified: Secondary | ICD-10-CM | POA: Diagnosis not present

## 2022-12-13 DIAGNOSIS — F419 Anxiety disorder, unspecified: Secondary | ICD-10-CM | POA: Diagnosis not present

## 2022-12-13 DIAGNOSIS — F32A Depression, unspecified: Secondary | ICD-10-CM | POA: Diagnosis not present

## 2022-12-15 DIAGNOSIS — Z419 Encounter for procedure for purposes other than remedying health state, unspecified: Secondary | ICD-10-CM | POA: Diagnosis not present

## 2022-12-20 DIAGNOSIS — F32A Depression, unspecified: Secondary | ICD-10-CM | POA: Diagnosis not present

## 2022-12-20 DIAGNOSIS — F419 Anxiety disorder, unspecified: Secondary | ICD-10-CM | POA: Diagnosis not present

## 2023-01-03 DIAGNOSIS — F419 Anxiety disorder, unspecified: Secondary | ICD-10-CM | POA: Diagnosis not present

## 2023-01-03 DIAGNOSIS — F32A Depression, unspecified: Secondary | ICD-10-CM | POA: Diagnosis not present

## 2023-01-10 DIAGNOSIS — F32A Depression, unspecified: Secondary | ICD-10-CM | POA: Diagnosis not present

## 2023-01-10 DIAGNOSIS — F419 Anxiety disorder, unspecified: Secondary | ICD-10-CM | POA: Diagnosis not present

## 2023-01-15 DIAGNOSIS — Z419 Encounter for procedure for purposes other than remedying health state, unspecified: Secondary | ICD-10-CM | POA: Diagnosis not present

## 2023-01-17 DIAGNOSIS — F419 Anxiety disorder, unspecified: Secondary | ICD-10-CM | POA: Diagnosis not present

## 2023-01-17 DIAGNOSIS — F32A Depression, unspecified: Secondary | ICD-10-CM | POA: Diagnosis not present

## 2023-05-07 ENCOUNTER — Emergency Department (HOSPITAL_COMMUNITY)
Admission: EM | Admit: 2023-05-07 | Discharge: 2023-05-07 | Discharge status: Against Medical Advice | Payer: 59 | Attending: Emergency Medicine | Admitting: Emergency Medicine

## 2023-05-07 ENCOUNTER — Other Ambulatory Visit: Payer: Self-pay

## 2023-05-07 ENCOUNTER — Encounter (HOSPITAL_COMMUNITY): Payer: Self-pay

## 2023-05-07 DIAGNOSIS — Z5321 Procedure and treatment not carried out due to patient leaving prior to being seen by health care provider: Secondary | ICD-10-CM | POA: Insufficient documentation

## 2023-05-07 DIAGNOSIS — R319 Hematuria, unspecified: Secondary | ICD-10-CM | POA: Diagnosis not present

## 2023-05-07 NOTE — ED Notes (Signed)
Called pt to room pt LWBS

## 2023-05-07 NOTE — ED Triage Notes (Signed)
Pt stated that he had intercourse last night and ejaculated only blood.Only urinated x2 today and blood had blood.   Pt stated he has had discharge from his penis x1 month.   Pt also complains on pain to RIGHT side.   Pt anxious and talking fast and triage.  Denies drug use.

## 2023-06-21 ENCOUNTER — Other Ambulatory Visit: Payer: Self-pay | Admitting: Advanced Practice Midwife

## 2023-06-21 MED ORDER — CEFIXIME 400 MG PO CAPS: 800.0000 mg | ORAL_CAPSULE | Freq: Once | ORAL | 0 refills | Status: AC

## 2023-06-21 NOTE — Progress Notes (Signed)
Suprax 800mg  PO X1 for GC contact Corrie Dandy Safewright ([birth year]).  Aware that preferred treatment is an IM injection of rocephin. Does not plan to do that.

## 2023-07-22 ENCOUNTER — Emergency Department (HOSPITAL_COMMUNITY): Payer: 59

## 2023-07-22 ENCOUNTER — Inpatient Hospital Stay (HOSPITAL_COMMUNITY)
Admission: EM | Admit: 2023-07-22 | Discharge: 2023-07-25 | DRG: 208 | Disposition: A | Discharge status: Home / Self Care | Payer: 59 | Attending: Family Medicine | Admitting: Family Medicine

## 2023-07-22 ENCOUNTER — Other Ambulatory Visit: Payer: Self-pay

## 2023-07-22 ENCOUNTER — Inpatient Hospital Stay (HOSPITAL_COMMUNITY): Payer: 59

## 2023-07-22 ENCOUNTER — Encounter (HOSPITAL_COMMUNITY): Payer: Self-pay

## 2023-07-22 DIAGNOSIS — F1190 Opioid use, unspecified, uncomplicated: Secondary | ICD-10-CM | POA: Diagnosis present

## 2023-07-22 DIAGNOSIS — F1510 Other stimulant abuse, uncomplicated: Secondary | ICD-10-CM | POA: Diagnosis present

## 2023-07-22 DIAGNOSIS — R7401 Elevation of levels of liver transaminase levels: Secondary | ICD-10-CM | POA: Diagnosis present

## 2023-07-22 DIAGNOSIS — E875 Hyperkalemia: Secondary | ICD-10-CM | POA: Diagnosis present

## 2023-07-22 DIAGNOSIS — Z882 Allergy status to sulfonamides status: Secondary | ICD-10-CM

## 2023-07-22 DIAGNOSIS — R7989 Other specified abnormal findings of blood chemistry: Secondary | ICD-10-CM | POA: Diagnosis present

## 2023-07-22 DIAGNOSIS — J690 Pneumonitis due to inhalation of food and vomit: Secondary | ICD-10-CM | POA: Diagnosis present

## 2023-07-22 DIAGNOSIS — J9690 Respiratory failure, unspecified, unspecified whether with hypoxia or hypercapnia: Secondary | ICD-10-CM | POA: Diagnosis present

## 2023-07-22 DIAGNOSIS — E440 Moderate protein-calorie malnutrition: Secondary | ICD-10-CM | POA: Diagnosis present

## 2023-07-22 DIAGNOSIS — Z841 Family history of disorders of kidney and ureter: Secondary | ICD-10-CM

## 2023-07-22 DIAGNOSIS — Z888 Allergy status to other drugs, medicaments and biological substances status: Secondary | ICD-10-CM | POA: Diagnosis not present

## 2023-07-22 DIAGNOSIS — M6282 Rhabdomyolysis: Secondary | ICD-10-CM | POA: Diagnosis present

## 2023-07-22 DIAGNOSIS — J9601 Acute respiratory failure with hypoxia: Principal | ICD-10-CM | POA: Diagnosis present

## 2023-07-22 DIAGNOSIS — Z6826 Body mass index (BMI) 26.0-26.9, adult: Secondary | ICD-10-CM | POA: Diagnosis not present

## 2023-07-22 DIAGNOSIS — J9600 Acute respiratory failure, unspecified whether with hypoxia or hypercapnia: Secondary | ICD-10-CM | POA: Diagnosis not present

## 2023-07-22 DIAGNOSIS — R569 Unspecified convulsions: Secondary | ICD-10-CM | POA: Diagnosis not present

## 2023-07-22 DIAGNOSIS — Z1152 Encounter for screening for COVID-19: Secondary | ICD-10-CM | POA: Diagnosis not present

## 2023-07-22 DIAGNOSIS — Z885 Allergy status to narcotic agent status: Secondary | ICD-10-CM | POA: Diagnosis not present

## 2023-07-22 DIAGNOSIS — Z809 Family history of malignant neoplasm, unspecified: Secondary | ICD-10-CM

## 2023-07-22 DIAGNOSIS — G928 Other toxic encephalopathy: Secondary | ICD-10-CM | POA: Diagnosis present

## 2023-07-22 DIAGNOSIS — Z9911 Dependence on respirator [ventilator] status: Secondary | ICD-10-CM | POA: Diagnosis not present

## 2023-07-22 DIAGNOSIS — F1410 Cocaine abuse, uncomplicated: Secondary | ICD-10-CM | POA: Diagnosis present

## 2023-07-22 DIAGNOSIS — R4182 Altered mental status, unspecified: Secondary | ICD-10-CM | POA: Diagnosis not present

## 2023-07-22 DIAGNOSIS — N179 Acute kidney failure, unspecified: Secondary | ICD-10-CM | POA: Diagnosis present

## 2023-07-22 DIAGNOSIS — F17210 Nicotine dependence, cigarettes, uncomplicated: Secondary | ICD-10-CM | POA: Diagnosis present

## 2023-07-22 DIAGNOSIS — T50904A Poisoning by unspecified drugs, medicaments and biological substances, undetermined, initial encounter: Secondary | ICD-10-CM

## 2023-07-22 DIAGNOSIS — Z9049 Acquired absence of other specified parts of digestive tract: Secondary | ICD-10-CM

## 2023-07-22 DIAGNOSIS — T50901A Poisoning by unspecified drugs, medicaments and biological substances, accidental (unintentional), initial encounter: Secondary | ICD-10-CM

## 2023-07-22 DIAGNOSIS — Z825 Family history of asthma and other chronic lower respiratory diseases: Secondary | ICD-10-CM

## 2023-07-22 DIAGNOSIS — F1910 Other psychoactive substance abuse, uncomplicated: Secondary | ICD-10-CM | POA: Diagnosis present

## 2023-07-22 DIAGNOSIS — Z8619 Personal history of other infectious and parasitic diseases: Secondary | ICD-10-CM

## 2023-07-22 DIAGNOSIS — E872 Acidosis, unspecified: Secondary | ICD-10-CM | POA: Diagnosis present

## 2023-07-22 DIAGNOSIS — J45909 Unspecified asthma, uncomplicated: Secondary | ICD-10-CM | POA: Diagnosis present

## 2023-07-22 DIAGNOSIS — Z8249 Family history of ischemic heart disease and other diseases of the circulatory system: Secondary | ICD-10-CM

## 2023-07-22 HISTORY — DX: Respiratory failure, unspecified, unspecified whether with hypoxia or hypercapnia: J96.90

## 2023-07-22 HISTORY — DX: Hyperkalemia: E87.5

## 2023-07-22 HISTORY — DX: Acute kidney failure, unspecified: N17.9

## 2023-07-22 HISTORY — DX: Poisoning by unspecified drugs, medicaments and biological substances, accidental (unintentional), initial encounter: T50.901A

## 2023-07-22 LAB — URINALYSIS, W/ REFLEX TO CULTURE (INFECTION SUSPECTED)
Bilirubin Urine: NEGATIVE
Glucose, UA: NEGATIVE mg/dL
Ketones, ur: NEGATIVE mg/dL
Leukocytes,Ua: NEGATIVE
Nitrite: NEGATIVE
Protein, ur: 30 mg/dL — AB
Specific Gravity, Urine: 1.012 (ref 1.005–1.030)
pH: 6 (ref 5.0–8.0)

## 2023-07-22 LAB — CBC WITH DIFFERENTIAL/PLATELET
Abs Immature Granulocytes: 0 10*3/uL (ref 0.00–0.07)
Band Neutrophils: 4 %
Basophils Absolute: 0 10*3/uL (ref 0.0–0.1)
Basophils Relative: 0 %
Eosinophils Absolute: 0 10*3/uL (ref 0.0–0.5)
Eosinophils Relative: 0 %
HCT: 52.7 % — ABNORMAL HIGH (ref 39.0–52.0)
Hemoglobin: 16.3 g/dL (ref 13.0–17.0)
Lymphocytes Relative: 7 %
Lymphs Abs: 1.1 10*3/uL (ref 0.7–4.0)
MCH: 28.2 pg (ref 26.0–34.0)
MCHC: 30.9 g/dL (ref 30.0–36.0)
MCV: 91.2 fL (ref 80.0–100.0)
Monocytes Absolute: 1.6 10*3/uL — ABNORMAL HIGH (ref 0.1–1.0)
Monocytes Relative: 10 %
Neutro Abs: 13.6 10*3/uL — ABNORMAL HIGH (ref 1.7–7.7)
Neutrophils Relative %: 79 %
Platelets: 199 10*3/uL (ref 150–400)
RBC: 5.78 MIL/uL (ref 4.22–5.81)
RDW: 14.2 % (ref 11.5–15.5)
WBC: 16.4 10*3/uL — ABNORMAL HIGH (ref 4.0–10.5)
nRBC: 0 % (ref 0.0–0.2)

## 2023-07-22 LAB — RAPID URINE DRUG SCREEN, HOSP PERFORMED
Amphetamines: POSITIVE — AB
Barbiturates: NOT DETECTED
Benzodiazepines: POSITIVE — AB
Cocaine: POSITIVE — AB
Opiates: NOT DETECTED
Tetrahydrocannabinol: POSITIVE — AB

## 2023-07-22 LAB — PROTIME-INR
INR: 1.1 (ref 0.8–1.2)
Prothrombin Time: 14.2 s (ref 11.4–15.2)

## 2023-07-22 LAB — BASIC METABOLIC PANEL
Anion gap: 14 (ref 5–15)
BUN: 25 mg/dL — ABNORMAL HIGH (ref 6–20)
CO2: 18 mmol/L — ABNORMAL LOW (ref 22–32)
Calcium: 8.2 mg/dL — ABNORMAL LOW (ref 8.9–10.3)
Chloride: 104 mmol/L (ref 98–111)
Creatinine, Ser: 2.5 mg/dL — ABNORMAL HIGH (ref 0.61–1.24)
GFR, Estimated: 35 mL/min — ABNORMAL LOW (ref >60)
Glucose, Bld: 99 mg/dL (ref 70–99)
Potassium: 6.8 mmol/L (ref 3.5–5.1)
Sodium: 136 mmol/L (ref 135–145)

## 2023-07-22 LAB — ETHANOL: Alcohol, Ethyl (B): <10 mg/dL (ref <10)

## 2023-07-22 LAB — BLOOD GAS, ARTERIAL
Acid-base deficit: 2.8 mmol/L — ABNORMAL HIGH (ref 0.0–2.0)
Bicarbonate: 20.8 mmol/L (ref 20.0–28.0)
Drawn by: 10555
O2 Saturation: 99.6 %
Patient temperature: 34.8
pCO2 arterial: 29 mm[Hg] — ABNORMAL LOW (ref 32–48)
pH, Arterial: 7.45 (ref 7.35–7.45)
pO2, Arterial: 222 mm[Hg] — ABNORMAL HIGH (ref 83–108)

## 2023-07-22 LAB — COMPREHENSIVE METABOLIC PANEL
ALT: 59 U/L — ABNORMAL HIGH (ref 0–44)
AST: 62 U/L — ABNORMAL HIGH (ref 15–41)
Albumin: 4.3 g/dL (ref 3.5–5.0)
Alkaline Phosphatase: 61 U/L (ref 38–126)
Anion gap: 17 — ABNORMAL HIGH (ref 5–15)
BUN: 24 mg/dL — ABNORMAL HIGH (ref 6–20)
CO2: 20 mmol/L — ABNORMAL LOW (ref 22–32)
Calcium: 8.5 mg/dL — ABNORMAL LOW (ref 8.9–10.3)
Chloride: 102 mmol/L (ref 98–111)
Creatinine, Ser: 2.79 mg/dL — ABNORMAL HIGH (ref 0.61–1.24)
GFR, Estimated: 31 mL/min — ABNORMAL LOW (ref >60)
Glucose, Bld: 101 mg/dL — ABNORMAL HIGH (ref 70–99)
Potassium: 7.1 mmol/L (ref 3.5–5.1)
Sodium: 139 mmol/L (ref 135–145)
Total Bilirubin: 0.6 mg/dL (ref 0.3–1.2)
Total Protein: 7.3 g/dL (ref 6.5–8.1)

## 2023-07-22 LAB — SARS CORONAVIRUS 2 BY RT PCR: SARS Coronavirus 2 by RT PCR: NEGATIVE

## 2023-07-22 LAB — ACETAMINOPHEN LEVEL: Acetaminophen (Tylenol), Serum: <10 ug/mL — ABNORMAL LOW (ref 10–30)

## 2023-07-22 LAB — CBG MONITORING, ED
Glucose-Capillary: 96 mg/dL (ref 70–99)
Glucose-Capillary: 92 mg/dL (ref 70–99)

## 2023-07-22 LAB — GLUCOSE, CAPILLARY: Glucose-Capillary: 106 mg/dL — ABNORMAL HIGH (ref 70–99)

## 2023-07-22 LAB — AMMONIA: Ammonia: 15 umol/L (ref 9–35)

## 2023-07-22 LAB — SALICYLATE LEVEL: Salicylate Lvl: <7 mg/dL — ABNORMAL LOW (ref 7.0–30.0)

## 2023-07-22 LAB — TROPONIN I (HIGH SENSITIVITY)
Troponin I (High Sensitivity): 61 ng/L — ABNORMAL HIGH (ref <18)
Troponin I (High Sensitivity): 78 ng/L — ABNORMAL HIGH (ref <18)

## 2023-07-22 LAB — CK: Total CK: 3908 U/L — ABNORMAL HIGH (ref 49–397)

## 2023-07-22 MED ORDER — IPRATROPIUM-ALBUTEROL 0.5-2.5 (3) MG/3ML IN SOLN
RESPIRATORY_TRACT | Status: AC
  Administered 2023-07-22: 3 mL via RESPIRATORY_TRACT
  Filled 2023-07-22: qty 3

## 2023-07-22 MED ORDER — SODIUM ZIRCONIUM CYCLOSILICATE 10 G PO PACK
10.0000 g | PACK | Freq: Once | ORAL | Status: AC
  Administered 2023-07-23: 10 g
  Filled 2023-07-22: qty 1

## 2023-07-22 MED ORDER — ROCURONIUM BROMIDE 50 MG/5ML IV SOLN
80.0000 mg | Freq: Once | INTRAVENOUS | Status: AC
  Filled 2023-07-22: qty 8

## 2023-07-22 MED ORDER — ALBUTEROL SULFATE (2.5 MG/3ML) 0.083% IN NEBU: 2.5000 mg | INHALATION_SOLUTION | Freq: Once | RESPIRATORY_TRACT | Status: AC

## 2023-07-22 MED ORDER — ADULT MULTIVITAMIN W/MINERALS CH
1.0000 | ORAL_TABLET | Freq: Every day | ORAL | Status: DC
  Administered 2023-07-23 – 2023-07-24 (×2): 1
  Filled 2023-07-22 (×2): qty 1

## 2023-07-22 MED ORDER — ROCURONIUM BROMIDE 10 MG/ML (PF) SYRINGE
PREFILLED_SYRINGE | INTRAVENOUS | Status: AC
  Administered 2023-07-22: 80 mg via INTRAVENOUS
  Filled 2023-07-22: qty 10

## 2023-07-22 MED ORDER — POLYETHYLENE GLYCOL 3350 17 G PO PACK
17.0000 g | PACK | Freq: Every day | ORAL | Status: DC
  Administered 2023-07-23 – 2023-07-24 (×2): 17 g
  Filled 2023-07-22 (×2): qty 1

## 2023-07-22 MED ORDER — PROPOFOL 1000 MG/100ML IV EMUL
INTRAVENOUS | Status: AC
  Administered 2023-07-22: 10 ug/kg/min via INTRAVENOUS
  Filled 2023-07-22: qty 100

## 2023-07-22 MED ORDER — NALOXONE HCL 2 MG/2ML IJ SOSY
2.0000 mg | PREFILLED_SYRINGE | Freq: Once | INTRAMUSCULAR | Status: AC
  Administered 2023-07-22: 2 mg via INTRAVENOUS
  Filled 2023-07-22: qty 2

## 2023-07-22 MED ORDER — CALCIUM GLUCONATE-NACL 1-0.675 GM/50ML-% IV SOLN
1.0000 g | Freq: Once | INTRAVENOUS | Status: AC
  Administered 2023-07-22: 1000 mg via INTRAVENOUS
  Filled 2023-07-22: qty 50

## 2023-07-22 MED ORDER — CALCIUM GLUCONATE-NACL 2-0.675 GM/100ML-% IV SOLN
2.0000 g | Freq: Once | INTRAVENOUS | Status: AC
  Administered 2023-07-23: 2000 mg via INTRAVENOUS
  Filled 2023-07-22: qty 100

## 2023-07-22 MED ORDER — DOCUSATE SODIUM 50 MG/5ML PO LIQD
100.0000 mg | Freq: Two times a day (BID) | ORAL | Status: DC
  Administered 2023-07-23 – 2023-07-24 (×3): 100 mg
  Filled 2023-07-22 (×3): qty 10

## 2023-07-22 MED ORDER — FENTANYL BOLUS VIA INFUSION
50.0000 ug | INTRAVENOUS | Status: DC | PRN
  Administered 2023-07-23 (×2): 50 ug via INTRAVENOUS

## 2023-07-22 MED ORDER — INSULIN ASPART 100 UNIT/ML IV SOLN
5.0000 [IU] | Freq: Once | INTRAVENOUS | Status: AC
  Administered 2023-07-22: 5 [IU] via INTRAVENOUS

## 2023-07-22 MED ORDER — ETOMIDATE 2 MG/ML IV SOLN: 25.0000 mg | Freq: Once | INTRAVENOUS | Status: AC

## 2023-07-22 MED ORDER — DEXTROSE 50 % IV SOLN
1.0000 | Freq: Once | INTRAVENOUS | Status: AC
  Administered 2023-07-22: 50 mL via INTRAVENOUS
  Filled 2023-07-22: qty 50

## 2023-07-22 MED ORDER — ORAL CARE MOUTH RINSE: 15.0000 mL | OROMUCOSAL | Status: DC | PRN

## 2023-07-22 MED ORDER — SODIUM CHLORIDE 0.9 % IV SOLN
3.0000 g | Freq: Once | INTRAVENOUS | Status: AC
  Administered 2023-07-22: 3 g via INTRAVENOUS
  Filled 2023-07-22: qty 8

## 2023-07-22 MED ORDER — DEXTROSE 50 % IV SOLN
50.0000 mL | Freq: Once | INTRAVENOUS | Status: AC
  Administered 2023-07-22: 50 mL via INTRAVENOUS
  Filled 2023-07-22: qty 50

## 2023-07-22 MED ORDER — FENTANYL CITRATE PF 50 MCG/ML IJ SOSY
50.0000 ug | PREFILLED_SYRINGE | Freq: Once | INTRAMUSCULAR | Status: AC
  Administered 2023-07-22: 50 ug via INTRAVENOUS

## 2023-07-22 MED ORDER — LACTATED RINGERS IV BOLUS
1000.0000 mL | Freq: Once | INTRAVENOUS | Status: AC
  Administered 2023-07-22: 1000 mL via INTRAVENOUS

## 2023-07-22 MED ORDER — ALBUTEROL SULFATE (2.5 MG/3ML) 0.083% IN NEBU
INHALATION_SOLUTION | RESPIRATORY_TRACT | Status: AC
  Filled 2023-07-22: qty 12

## 2023-07-22 MED ORDER — ALBUTEROL SULFATE (2.5 MG/3ML) 0.083% IN NEBU
10.0000 mg | INHALATION_SOLUTION | Freq: Once | RESPIRATORY_TRACT | Status: AC
  Administered 2023-07-22: 10 mg via RESPIRATORY_TRACT

## 2023-07-22 MED ORDER — CHLORHEXIDINE GLUCONATE CLOTH 2 % EX PADS
6.0000 | MEDICATED_PAD | Freq: Every day | CUTANEOUS | Status: DC
  Administered 2023-07-23 – 2023-07-25 (×3): 6 via TOPICAL

## 2023-07-22 MED ORDER — FAMOTIDINE 20 MG PO TABS
20.0000 mg | ORAL_TABLET | Freq: Two times a day (BID) | ORAL | Status: DC
  Administered 2023-07-23 – 2023-07-24 (×3): 20 mg
  Filled 2023-07-22 (×3): qty 1

## 2023-07-22 MED ORDER — MIDAZOLAM HCL 2 MG/2ML IJ SOLN
1.0000 mg | INTRAMUSCULAR | Status: DC | PRN
  Administered 2023-07-22: 2 mg via INTRAVENOUS
  Filled 2023-07-22: qty 2

## 2023-07-22 MED ORDER — HEPARIN SODIUM (PORCINE) 5000 UNIT/ML IJ SOLN
5000.0000 [IU] | Freq: Three times a day (TID) | INTRAMUSCULAR | Status: DC
  Administered 2023-07-23 – 2023-07-24 (×3): 5000 [IU] via SUBCUTANEOUS
  Filled 2023-07-22 (×3): qty 1

## 2023-07-22 MED ORDER — PROPOFOL 1000 MG/100ML IV EMUL
0.0000 ug/kg/min | INTRAVENOUS | Status: DC
  Filled 2023-07-22: qty 100

## 2023-07-22 MED ORDER — ETOMIDATE 2 MG/ML IV SOLN
INTRAVENOUS | Status: AC
  Administered 2023-07-22: 25 mg via INTRAVENOUS
  Filled 2023-07-22: qty 20

## 2023-07-22 MED ORDER — DOCUSATE SODIUM 50 MG/5ML PO LIQD: 100.0000 mg | Freq: Two times a day (BID) | ORAL | Status: DC | PRN

## 2023-07-22 MED ORDER — PROPOFOL 1000 MG/100ML IV EMUL: 5.0000 ug/kg/min | INTRAVENOUS | Status: DC

## 2023-07-22 MED ORDER — SODIUM CHLORIDE 0.9 % IV SOLN
3.0000 g | Freq: Four times a day (QID) | INTRAVENOUS | Status: DC
  Administered 2023-07-23 – 2023-07-25 (×10): 3 g via INTRAVENOUS
  Filled 2023-07-22 (×10): qty 8

## 2023-07-22 MED ORDER — ACETAMINOPHEN 325 MG PO TABS: 650.0000 mg | ORAL_TABLET | ORAL | Status: DC | PRN

## 2023-07-22 MED ORDER — ALBUTEROL SULFATE (2.5 MG/3ML) 0.083% IN NEBU
INHALATION_SOLUTION | RESPIRATORY_TRACT | Status: AC
  Administered 2023-07-22: 2.5 mg via RESPIRATORY_TRACT
  Filled 2023-07-22: qty 3

## 2023-07-22 MED ORDER — POLYETHYLENE GLYCOL 3350 17 G PO PACK: 17.0000 g | PACK | Freq: Every day | ORAL | Status: DC | PRN

## 2023-07-22 MED ORDER — ORAL CARE MOUTH RINSE
15.0000 mL | OROMUCOSAL | Status: DC
  Administered 2023-07-23 – 2023-07-24 (×19): 15 mL via OROMUCOSAL

## 2023-07-22 MED ORDER — FENTANYL CITRATE PF 50 MCG/ML IJ SOSY
50.0000 ug | PREFILLED_SYRINGE | INTRAMUSCULAR | Status: DC | PRN
  Administered 2023-07-22 (×2): 50 ug via INTRAVENOUS
  Filled 2023-07-22 (×2): qty 1

## 2023-07-22 MED ORDER — IPRATROPIUM-ALBUTEROL 0.5-2.5 (3) MG/3ML IN SOLN: 3.0000 mL | Freq: Once | RESPIRATORY_TRACT | Status: AC

## 2023-07-22 MED ORDER — INSULIN ASPART 100 UNIT/ML IV SOLN
10.0000 [IU] | Freq: Once | INTRAVENOUS | Status: AC
  Administered 2023-07-22: 10 [IU] via INTRAVENOUS

## 2023-07-22 MED ORDER — THIAMINE HCL 100 MG/ML IJ SOLN: 100.0000 mg | Freq: Every day | INTRAMUSCULAR | Status: DC

## 2023-07-22 MED ORDER — LACTATED RINGERS IV SOLN: INTRAVENOUS | Status: DC

## 2023-07-22 MED ORDER — FENTANYL CITRATE PF 50 MCG/ML IJ SOSY: 50.0000 ug | PREFILLED_SYRINGE | INTRAMUSCULAR | Status: DC | PRN

## 2023-07-22 MED ORDER — ALBUTEROL SULFATE (2.5 MG/3ML) 0.083% IN NEBU
10.0000 mg | INHALATION_SOLUTION | Freq: Once | RESPIRATORY_TRACT | Status: AC
  Administered 2023-07-22: 10 mg via RESPIRATORY_TRACT
  Filled 2023-07-22: qty 12

## 2023-07-22 MED ORDER — FOLIC ACID 1 MG PO TABS
1.0000 mg | ORAL_TABLET | Freq: Every day | ORAL | Status: DC
  Administered 2023-07-23 – 2023-07-24 (×2): 1 mg
  Filled 2023-07-22 (×2): qty 1

## 2023-07-22 MED ORDER — FENTANYL 2500MCG IN NS 250ML (10MCG/ML) PREMIX INFUSION
50.0000 ug/h | INTRAVENOUS | Status: DC
  Administered 2023-07-22: 50 ug/h via INTRAVENOUS
  Filled 2023-07-22: qty 250

## 2023-07-22 NOTE — H&P (Addendum)
NAME:  Donald Marshall, MRN:  [id number], DOB:  [birth year], LOS: 0 ADMISSION DATE:  07/22/2023, CONSULTATION DATE:  10/6 REFERRING MD:  Dr. Criss Alvine, CHIEF COMPLAINT:  OD   History of Present Illness:  Patient is a 27 year old male presented to [facility] ED with AMS.  On 10/6 EMS called for patient with AMS.  On arrival patient unresponsive and soft BP.  Per girlfriend has been unresponsive for about 2 hours.  Girlfriend gave him intranasal Narcan with no change in mental status.  She believes he used heroin last night; methamphetamines.  EMS concern for possible posturing versus seizure and was given Versed.  Transferred to [facility] ED.  On ED arrival, patient GCS 4.  BP 89/48 and tachycardic 151.  Sats 85%.  Patient intubated.  Noted to be hypothermic 94 F and WBC 16.4.  Cultures obtained, given IV fluids, started on Unasyn.  CT head with no acute abnormality; LUL patchy ground glass opacities.  CBG 96.  Ethanol, ammonia, acetaminophen, salicylate level WNL.  UDS positive for cocaine, benzos, amphetamines, THC.  K7.1 and creat 2.79.  Given calcium, insulin/dextrose, albuterol. CK 3,908. PCCM consulted for icu admission and transfer to [facility].   Pertinent  Medical History   Past Medical History:  Diagnosis Date   Asthma    Herpes genitalis in men      Significant Hospital Events: Including procedures, antibiotic start and stop dates in addition to other pertinent events   10/6 admitted to aph w/ overdose; intubated; transferred to [facility]; pccm consulted  Interim History / Subjective:  Intubated on mech vent Required increase in sedation during transport Currently just on propofol  Objective   Blood pressure 135/84, pulse 91, temperature (!) 94.7 F (34.8 C), temperature source Bladder, resp. rate (!) 24, height 5\' 9"  (1.753 m), SpO2 100%.    Vent Mode: PRVC FiO2 (%):  [40 %-100 %] 40 % Set Rate:  [24 bmp] 24 bmp Vt Set:  [520 mL-560 mL] 560 mL PEEP:  [5 cmH20] 5 cmH20 Plateau Pressure:  [16  cmH20] 16 cmH20   Intake/Output Summary (Last 24 hours) at 07/22/2023 2021 Last data filed at 07/22/2023 2017 Gross per 24 hour  Intake 41.16 ml  Output --  Net 41.16 ml   There were no vitals filed for this visit.  Examination: General:  critically ill appearing on mech vent HEENT: MM pink/moist; ETT in place Neuro: sedate on prop; PERRL CV: s1s2, RRR, no m/r/g PULM:  dim clear BS bilaterally; on mech vent PRVC GI: soft, bsx4 active  Extremities: warm/dry, no edema  Skin: no rashes or lesions    Resolved Hospital Problem list     Assessment & Plan:  Acute encephalopathy in setting of drug overdose: Per girlfriend patient took heroin and methamphetamines; UDS positive for cocaine, benzos, amphetamines, THC. Polysubstance abuse -ct head no acute abnormality; ammonia, ethanol wnl Plan: -limit sedating meds -treat for sepsis as below -wean sedation for RASS 0 to -1  Acute respiratory failure with hypoxia LUL opacity: aspiration? Plan: -cxr on arrival  -LTVV strategy with tidal volumes of 6-8 cc/kg ideal body weight -check ABG and adjust settings accordingly  -Wean PEEP/FiO2 for SpO2 >92% -VAP bundle in place -Daily SAT and SBT -PAD protocol in place -wean sedation for RASS goal 0 to -1 -Tracheal aspirate -Continue Unasyn  AKI Rhabdomylosis Hyperkalemia AGMA Plan: -Give more calcium, insulin/dextrose, lokelma, albuterol -IV fluids -trend CK -Trend BMP / urinary output -Replace electrolytes as indicated -Avoid nephrotoxic agents, ensure adequate renal  perfusion  Sepsis: likely in setting of pna Plan: -cont unasyn  -follow cultures -trend wbc/fever curve -trend la -iv fluids -check pct -cont bair hugger for hypothermia  Elevated trop Plan: -trend trop -tele monitoring  Mildly elevated LFTs Plan: -Trend CMP  Best Practice (right click and "Reselect all SmartList Selections" daily)   Diet/type: NPO w/ meds via tube DVT prophylaxis: prophylactic  heparin  GI prophylaxis: H2B Lines: N/A Foley:  N/A Code Status:  full code Last date of multidisciplinary goals of care discussion [10/6 updated ashlyn over phone]  Labs   CBC: Recent Labs  Lab 07/22/23 1822  WBC 16.4*  NEUTROABS 13.6*  HGB 16.3  HCT 52.7*  MCV 91.2  PLT 199    Basic Metabolic Panel: Recent Labs  Lab 07/22/23 1822  NA 139  K 7.1*  CL 102  CO2 20*  GLUCOSE 101*  BUN 24*  CREATININE 2.79*  CALCIUM 8.5*   GFR: CrCl cannot be calculated (Unknown ideal weight.). Recent Labs  Lab 07/22/23 1822  WBC 16.4*    Liver Function Tests: Recent Labs  Lab 07/22/23 1822  AST 62*  ALT 59*  ALKPHOS 61  BILITOT 0.6  PROT 7.3  ALBUMIN 4.3   No results for input(s): "LIPASE", "AMYLASE" in the last 168 hours. Recent Labs  Lab 07/22/23 1926  AMMONIA 15    ABG    Component Value Date/Time   PHART 7.45 07/22/2023 1945   PCO2ART 29 (L) 07/22/2023 1945   PO2ART 222 (H) 07/22/2023 1945   HCO3 20.8 07/22/2023 1945   ACIDBASEDEF 2.8 (H) 07/22/2023 1945   O2SAT 99.6 07/22/2023 1945     Coagulation Profile: Recent Labs  Lab 07/22/23 1926  INR 1.1    Cardiac Enzymes: No results for input(s): "CKTOTAL", "CKMB", "CKMBINDEX", "TROPONINI" in the last 168 hours.  HbA1C: No results found for: "HGBA1C"  CBG: Recent Labs  Lab 07/22/23 1800 07/22/23 1934  GLUCAP 96 92    Review of Systems:   Patient is encephalopathic and/or intubated; therefore, history has been obtained from chart review.    Past Medical History:  He,  has a past medical history of Asthma and Herpes genitalis in men.   Surgical History:   Past Surgical History:  Procedure Laterality Date   APPENDECTOMY     FRACTURE SURGERY       Social History:   reports that he has been smoking cigarettes. He does not have any smokeless tobacco history on file. He reports current alcohol use. He reports current drug use. Drug: Marijuana.   Family History:  His family history  includes Asthma in an other family member; COPD in his mother; Cancer in an other family member; Heart failure in an other family member; Hypertension in his mother; Kidney failure in his mother.   Allergies Allergies  Allergen Reactions   Hydrocodone Swelling   Sulfa Antibiotics Itching   Tramadol Itching     Home Medications  Prior to Admission medications   Medication Sig Start Date End Date Taking? Authorizing Provider  Buprenorphine HCl-Naloxone HCl 8-2 MG FILM Place 1 Film under the tongue 2 (two) times daily. 06/28/23  Yes [provider]  oxyCODONE-acetaminophen (PERCOCET/ROXICET) 5-325 MG tablet Take 1 tablet by mouth every 4 (four) hours as needed. 10/24/22  Yes Kommor, Madison, MD  naloxone [facility]) nasal spray 4 mg/0.1 mL Place 1 spray into the nose once. 07/22/23   [provider]     Critical care time: 45 minutes    JD  Anselm Lis [facility] 07/22/2023, 8:21 PM  Please see [email or web address] for pager details.  From 7A-7P if no response, please call [phone]. After hours, please call ELink [phone].

## 2023-07-22 NOTE — ED Triage Notes (Signed)
Pt bib [facility]. Uses meth regularly used heroin last night, girlfriend went and brought narcan from [employer] and gave 2 doses. When ems arrived pt was moaning/rigged, pupils was a 4. Ems gave 5 mg of versed IM, pt was found on the floor unsure if pt fell or not.

## 2023-07-22 NOTE — Progress Notes (Signed)
eLink Physician-Brief Progress Note Patient Name: Donald Marshall DOB: [birth year] MRN: [id number]   Date of Service  07/22/2023  HPI/Events of Note  26/M with history of polysubstance who presents with AMS. On arrival to the ED, he was hypotensive, hypoxemic, with a GCS 4. He was intubated for airway protection.   Subsequent workup was positive for cocaine, BZP, amphetamines, THC. He also had a LUL opacity, rhabdomyolysis, AKI with hyperkalmia  BP 98/64 HR 97 RR 20 SPO2 100% On vent TV 560, RR 20, FiO2 40%, PEEP 5; peak pressures 21  eICU Interventions  Acute toxic encephalopathy - Pt intubated for airway protection - CT head negative for acute intacranial process.  - Will continue supportive measures:     - Vent support as below    - Started on empiric antibiotics    - Correct underlying electrolyte abnormalities    - Maintain normoglycemia.   Acute respiratory failure - Pt intubated for airway protection - Maintain TV 4-74ml/kg PBW, target plateau pressures <30 - Titrate FiO2 and PEEP to maintain SPO2>90% - Started on Unasyn to address aspiration pneumonia - Follow cultures, deescalate as warranted - Trend WBC, lactate, temperature curve.  - Plan for daily sedation holiday, SBT  AKI - May be from rhabdomyolysis, decreased renal perfusion - Started on IVF. Will continue to monitor I/Os closely  - Maintain MAP >65 - Pt given calcium, insulin+dextrose, lokelma, albuterol for hyperkalemia. Will continue to monitor serial BMP. May need HD if hyperkalemia is refractory to medical measures  DVT ppx - heparin [place]        [name] 07/22/2023, 11:23 PM

## 2023-07-22 NOTE — ED Notes (Signed)
4 attempts to insert OG tube with no success. Dr also attempted with no success.

## 2023-07-22 NOTE — Progress Notes (Signed)
Pharmacy Antibiotic Note  Donald Marshall is a 27 y.o. male presenting to ED on 07/22/2023 after being found unresponsive due to possible drug overdose (given narcan x2 in ED with no improvement) and was ultimately intubated. EMS reported possible seizure activity while en route to ED. Pharmacy has been consulted to initiate Unasyn for aspiration pneumonia coverage.  Plan: Start Unasyn 3 g IV every 6 hours based on current renal function Monitor renal function, clinical status, culture data, and LOT  Height: 5\' 9"  (175.3 cm) IBW/kg (Calculated) : 70.7  Temp (24hrs), Avg:94.2 F (34.6 C), Min:93.6 F (34.2 C), Max:94.7 F (34.8 C)  Recent Labs  Lab 07/22/23 1822  WBC 16.4*  CREATININE 2.79*    CrCl cannot be calculated (Unknown ideal weight.).    Allergies  Allergen Reactions   Hydrocodone Swelling   Sulfa Antibiotics Itching   Tramadol Itching    Antimicrobials this admission: Unasyn 10/06 >>   Dose adjustments this admission: N/A  Microbiology results: 10/06 BCx: pending 10/06 UCx: pending   Thank you for involving pharmacy in this patient's care.   Rockwell Alexandria, PharmD Clinical Pharmacist 07/22/2023 8:16 PM

## 2023-07-22 NOTE — ED Notes (Signed)
Pt intubated at 1830. Etomadate given at 1827 Rocuronium given at 1828 ET 24 at the lip 8 ET

## 2023-07-22 NOTE — ED Provider Notes (Signed)
[place] EMERGENCY DEPARTMENT AT [facility] Provider Note   CSN: [id number] Arrival date & time: 07/22/23  1757     History  Chief Complaint  Patient presents with   Drug Overdose    Donald Marshall is a 28 y.o. male.  HPI 27 year old male presents with altered mental status. Patient is currently unresponsive and unable to give history.  History is given primarily from EMS.  Girlfriend called EMS and he has been unresponsive for at least 2 hours.  Is unclear when exactly his last known well was but she has seen him in this state for at least 2 hours.  She went to the drugstore and got Narcan and gave him 2 intranasal doses with no relief.  She thinks he last used heroin last night.  Also uses methamphetamines.  EMS noted him to be altered and a little hypotensive.  She they gave IM Versed after they saw what appeared to be posturing versus seizure.  Home Medications Prior to Admission medications   Medication Sig Start Date End Date Taking? Authorizing Provider  Buprenorphine HCl-Naloxone HCl 8-2 MG FILM Place 1 Film under the tongue 2 (two) times daily. 06/28/23  Yes [provider]  oxyCODONE-acetaminophen (PERCOCET/ROXICET) 5-325 MG tablet Take 1 tablet by mouth every 4 (four) hours as needed. 10/24/22  Yes Kommor, Madison, MD  naloxone [facility]) nasal spray 4 mg/0.1 mL Place 1 spray into the nose once. 07/22/23   [provider]      Allergies    Hydrocodone, Sulfa antibiotics, and Tramadol    Review of Systems   Review of Systems  Unable to perform ROS: Patient unresponsive    Physical Exam Updated Vital Signs BP 117/79   Pulse 94   Temp (!) 94.7 F (34.8 C)   Resp (!) 24   Ht 5\' 9"  (1.753 m)   SpO2 100%   BMI 26.58 kg/m  Physical Exam Vitals and nursing note reviewed.  Constitutional:      Appearance: He is well-developed.  HENT:     Head: Normocephalic and atraumatic.  Eyes:     Comments: Pupils are miotic, symmetric   Cardiovascular:     Rate and Rhythm: Regular rhythm. Tachycardia present.     Heart sounds: Normal heart sounds.     Comments: HR~100 Pulmonary:     Effort: Pulmonary effort is normal. Tachypnea present. No accessory muscle usage.     Breath sounds: Wheezing and rhonchi present.     Comments: Patient appears to be grunting while breathing. Abdominal:     Palpations: Abdomen is soft.     Tenderness: There is no abdominal tenderness.  Musculoskeletal:     Cervical back: No rigidity.  Skin:    General: Skin is warm and dry.  Neurological:     Mental Status: He is unresponsive.     GCS: GCS eye subscore is 1. GCS verbal subscore is 1. GCS motor subscore is 4.     Comments: Patient localizes to painful stimuli.  At times he seems to have rhythmic movements of his arms that could be posturing but seem to be consistent with myoclonic jerking.     ED Results / Procedures / Treatments   Labs (all labs ordered are listed, but only abnormal results are displayed) Labs Reviewed  COMPREHENSIVE METABOLIC PANEL - Abnormal; Notable for the following components:      Result Value   Potassium 7.1 (*)    CO2 20 (*)    Glucose, Bld  101 (*)    BUN 24 (*)    Creatinine, Ser 2.79 (*)    Calcium 8.5 (*)    AST 62 (*)    ALT 59 (*)    GFR, Estimated 31 (*)    Anion gap 17 (*)    All other components within normal limits  CBC WITH DIFFERENTIAL/PLATELET - Abnormal; Notable for the following components:   WBC 16.4 (*)    HCT 52.7 (*)    Neutro Abs 13.6 (*)    Monocytes Absolute 1.6 (*)    All other components within normal limits  URINALYSIS, W/ REFLEX TO CULTURE (INFECTION SUSPECTED) - Abnormal; Notable for the following components:   APPearance HAZY (*)    Hgb urine dipstick LARGE (*)    Protein, ur 30 (*)    Bacteria, UA RARE (*)    All other components within normal limits  RAPID URINE DRUG SCREEN, HOSP PERFORMED - Abnormal; Notable for the following components:   Cocaine POSITIVE (*)     Benzodiazepines POSITIVE (*)    Amphetamines POSITIVE (*)    Tetrahydrocannabinol POSITIVE (*)    All other components within normal limits  BLOOD GAS, ARTERIAL - Abnormal; Notable for the following components:   pCO2 arterial 29 (*)    pO2, Arterial 222 (*)    Acid-base deficit 2.8 (*)    All other components within normal limits  SALICYLATE LEVEL - Abnormal; Notable for the following components:   Salicylate Lvl <7.0 (*)    All other components within normal limits  ACETAMINOPHEN LEVEL - Abnormal; Notable for the following components:   Acetaminophen (Tylenol), Serum <10 (*)    All other components within normal limits  CK - Abnormal; Notable for the following components:   Total CK 3,908 (*)    All other components within normal limits  TROPONIN I (HIGH SENSITIVITY) - Abnormal; Notable for the following components:   Troponin I (High Sensitivity) 61 (*)    All other components within normal limits  TROPONIN I (HIGH SENSITIVITY) - Abnormal; Notable for the following components:   Troponin I (High Sensitivity) 78 (*)    All other components within normal limits  CULTURE, BLOOD (ROUTINE X 2)  CULTURE, BLOOD (ROUTINE X 2)  SARS CORONAVIRUS 2 BY RT PCR  URINE CULTURE  ETHANOL  PROTIME-INR  AMMONIA  CK  BASIC METABOLIC PANEL  CBG MONITORING, ED  I-STAT CHEM 8, ED  CBG MONITORING, ED    EKG EKG Interpretation Date/Time:  Sunday July 22 2023 18:02:40 EDT Ventricular Rate:  96 PR Interval:  146 QRS Duration:  98 QT Interval:  371 QTC Calculation: 469 R Axis:   93  Text Interpretation: Sinus rhythm Borderline right axis deviation Borderline prolonged QT interval No old tracing to compare Confirmed by Pricilla Loveless [phone]) on 07/22/2023 6:13:08 PM  Radiology CT Head Wo Contrast  Result Date: 07/22/2023 CLINICAL DATA:  Mental status change, unknown cause; Neck trauma, intoxicated or obtunded (Age >= 16y) EXAM: CT HEAD WITHOUT CONTRAST CT CERVICAL SPINE WITHOUT  CONTRAST TECHNIQUE: Multidetector CT imaging of the head and cervical spine was performed following the standard protocol without intravenous contrast. Multiplanar CT image reconstructions of the cervical spine were also generated. RADIATION DOSE REDUCTION: This exam was performed according to the departmental dose-optimization program which includes automated exposure control, adjustment of the mA and/or kV according to patient size and/or use of iterative reconstruction technique. COMPARISON:  None Available. FINDINGS: CT HEAD FINDINGS Brain: No evidence of large-territorial acute infarction.  No parenchymal hemorrhage. No mass lesion. No extra-axial collection. No mass effect or midline shift. No hydrocephalus. Basilar cisterns are patent. Vascular: No hyperdense vessel. Skull: No acute fracture or focal lesion. Sinuses/Orbits: Paranasal sinuses and mastoid air cells are clear. The orbits are unremarkable. Other: None. CT CERVICAL SPINE FINDINGS Alignment: Normal. Skull base and vertebrae: No acute fracture. No aggressive appearing focal osseous lesion or focal pathologic process. Soft tissues and spinal canal: No prevertebral fluid or swelling. No visible canal hematoma. Upper chest: Left upper lobe patchy ground-glass airspace opacities. Other: Secretions within the posterior nasal cavities, nasopharynx, oropharynx. IMPRESSION: 1. No acute intracranial abnormality. 2. No acute displaced fracture or traumatic listhesis of the cervical spine. 3. Left upper lobe patchy ground-glass airspace opacities. Findings suggestive developing infection/inflammation. Electronically Signed   By: Tish Frederickson M.D.   On: 07/22/2023 19:47   CT Cervical Spine Wo Contrast  Result Date: 07/22/2023 CLINICAL DATA:  Mental status change, unknown cause; Neck trauma, intoxicated or obtunded (Age >= 16y) EXAM: CT HEAD WITHOUT CONTRAST CT CERVICAL SPINE WITHOUT CONTRAST TECHNIQUE: Multidetector CT imaging of the head and cervical  spine was performed following the standard protocol without intravenous contrast. Multiplanar CT image reconstructions of the cervical spine were also generated. RADIATION DOSE REDUCTION: This exam was performed according to the departmental dose-optimization program which includes automated exposure control, adjustment of the mA and/or kV according to patient size and/or use of iterative reconstruction technique. COMPARISON:  None Available. FINDINGS: CT HEAD FINDINGS Brain: No evidence of large-territorial acute infarction. No parenchymal hemorrhage. No mass lesion. No extra-axial collection. No mass effect or midline shift. No hydrocephalus. Basilar cisterns are patent. Vascular: No hyperdense vessel. Skull: No acute fracture or focal lesion. Sinuses/Orbits: Paranasal sinuses and mastoid air cells are clear. The orbits are unremarkable. Other: None. CT CERVICAL SPINE FINDINGS Alignment: Normal. Skull base and vertebrae: No acute fracture. No aggressive appearing focal osseous lesion or focal pathologic process. Soft tissues and spinal canal: No prevertebral fluid or swelling. No visible canal hematoma. Upper chest: Left upper lobe patchy ground-glass airspace opacities. Other: Secretions within the posterior nasal cavities, nasopharynx, oropharynx. IMPRESSION: 1. No acute intracranial abnormality. 2. No acute displaced fracture or traumatic listhesis of the cervical spine. 3. Left upper lobe patchy ground-glass airspace opacities. Findings suggestive developing infection/inflammation. Electronically Signed   By: Tish Frederickson M.D.   On: 07/22/2023 19:47   DG Chest Portable 1 View  Result Date: 07/22/2023 CLINICAL DATA:  Intubation Uses meth regularly used heroin last night, girlfriend went and brought narcan from [employer] and gave 2 doses. When ems arrived pt was moaning/rigged, pupils was a 4. EXAM: PORTABLE CHEST 1 VIEW COMPARISON:  Chest x-ray 07/22/2023 6:12 p.m. FINDINGS: Interval placement endotracheal  tube with tip terminating 3 cm above the carina. The heart and mediastinal contours are within normal limits. No focal consolidation. No pulmonary edema. No pleural effusion. No pneumothorax. No acute osseous abnormality. IMPRESSION: 1. Endotracheal tube in good position. 2. No active disease. Electronically Signed   By: Tish Frederickson M.D.   On: 07/22/2023 19:43   DG Chest Portable 1 View  Result Date: 07/22/2023 CLINICAL DATA:  hypoxia EXAM: PORTABLE CHEST 1 VIEW COMPARISON:  Chest x-ray 11/16/2013 FINDINGS: The heart and mediastinal contours are within normal limits. No focal consolidation. No pulmonary edema. No pleural effusion. No pneumothorax. No acute osseous abnormality. IMPRESSION: No active disease. Electronically Signed   By: Tish Frederickson M.D.   On: 07/22/2023 19:42    Procedures Procedure  Name: Intubation Date/Time: 07/22/2023 6:39 PM  Performed by: Pricilla Loveless, MDPre-anesthesia Checklist: Patient identified, Patient being monitored, Emergency Drugs available, Timeout performed and Suction available Oxygen Delivery Method: Non-rebreather mask Preoxygenation: Pre-oxygenation with 100% oxygen Induction Type: Rapid sequence Ventilation: Mask ventilation without difficulty Laryngoscope Size: Glidescope and 3 Grade View: Grade I Tube size: 8.0 mm Number of attempts: 1 Airway Equipment and Method: Video-laryngoscopy Placement Confirmation: ETT inserted through vocal cords under direct vision, CO2 detector and Breath sounds checked- equal and bilateral Secured at: 24 cm Tube secured with: ETT holder Dental Injury: Teeth and Oropharynx as per pre-operative assessment     .Critical Care  Performed by: Pricilla Loveless, MD Authorized by: Pricilla Loveless, MD   Critical care provider statement:    Critical care time (minutes):  60   Critical care time was exclusive of:  Separately billable procedures and treating other patients   Critical care was necessary to treat or  prevent imminent or life-threatening deterioration of the following conditions:  CNS failure or compromise, respiratory failure and renal failure   Critical care was time spent personally by me on the following activities:  Development of treatment plan with patient or surrogate, discussions with consultants, evaluation of patient's response to treatment, examination of patient, ordering and review of laboratory studies, ordering and review of radiographic studies, ordering and performing treatments and interventions, pulse oximetry, re-evaluation of patient's condition and review of old charts     Medications Ordered in ED Medications  fentaNYL (SUBLIMAZE) injection 50 mcg (has no administration in time range)  fentaNYL (SUBLIMAZE) injection 50-200 mcg (has no administration in time range)  midazolam (VERSED) injection 1-2 mg (2 mg Intravenous Given 07/22/23 2027)  albuterol (PROVENTIL) (2.5 MG/3ML) 0.083% nebulizer solution (  Not Given 07/22/23 1957)  sodium zirconium cyclosilicate (LOKELMA) packet 10 g (has no administration in time range)  propofol (DIPRIVAN) 1000 MG/100ML infusion (has no administration in time range)  naloxone [facility]) injection 2 mg (2 mg Intravenous Given 07/22/23 1806)  lactated ringers bolus 1,000 mL (1,000 mLs Intravenous Bolus 07/22/23 1808)  etomidate (AMIDATE) injection 25 mg (25 mg Intravenous Given 07/22/23 1837)  lactated ringers bolus 1,000 mL (1,000 mLs Intravenous Bolus 07/22/23 1839)  rocuronium (ZEMURON) injection 80 mg (80 mg Intravenous Given 07/22/23 1836)  ipratropium-albuterol (DUONEB) 0.5-2.5 (3) MG/3ML nebulizer solution 3 mL (3 mLs Nebulization Given 07/22/23 1851)  albuterol (PROVENTIL) (2.5 MG/3ML) 0.083% nebulizer solution 2.5 mg (2.5 mg Nebulization Given 07/22/23 1851)  calcium gluconate 1 g/ 50 mL sodium chloride IVPB (0 mg Intravenous Stopped 07/22/23 2017)  albuterol (PROVENTIL) (2.5 MG/3ML) 0.083% nebulizer solution 10 mg (10 mg Nebulization Given  07/22/23 1957)  insulin aspart (novoLOG) injection 5 Units (5 Units Intravenous Given 07/22/23 1947)    And  dextrose 50 % solution 50 mL (50 mLs Intravenous Given 07/22/23 1946)  lactated ringers bolus 1,000 mL (1,000 mLs Intravenous New Bag/Given 07/22/23 2001)  Ampicillin-Sulbactam (UNASYN) 3 g in sodium chloride 0.9 % 100 mL IVPB (3 g Intravenous New Bag/Given 07/22/23 2033)    ED Course/ Medical Decision Making/ A&P                                 Medical Decision Making Amount and/or Complexity of Data Reviewed Labs: ordered.    Details: Hyperkalemia, AKI. Radiology: ordered and independent interpretation performed.    Details: No head bleed.  ET tube in appropriate position ECG/medicine tests: ordered and independent  interpretation performed.    Details: No significant signs of hyperkalemia  Risk OTC drugs. Prescription drug management. Decision regarding hospitalization.   Patient presents being found down.  Later and noted that  fianc arrived she had last seen him normal at 5 AM when she went to sleep and woke up at 3 PM.  He was unresponsive then and so she went to the store and got Narcan.  He did not wake up with Narcan here.  He was intubated for airway protection I am concerned he was having some myoclonic jerking.  Found to have an AKI.  Discussed his AKI/hyperkalemia with Dr. Arlean Hopping.  At first he had no urine output with the Foley but started making urine pretty quickly.  Will continue to give IV fluids.  Probably this is related to rhabdomyolysis.  Dr. Arta Silence indicates doing typical hyperkalemia treatments and ICU can call back if these are not resolving his AKI.  Otherwise, patient is now starting to wake up a little bit and so propofol will be started.  Discussed with Dr. Isaiah Serge, will also start Unasyn given what appears to be an aspiration pneumonia on the CT of the cervical spine.  Admit to ICU in critical condition.        Final Clinical Impression(s) / ED  Diagnoses Final diagnoses:  Acute respiratory failure with hypoxia (HCC)  Acute kidney injury (HCC)  Hyperkalemia    Rx / DC Orders ED Discharge Orders     None         Pricilla Loveless, MD 07/22/23 2108

## 2023-07-22 NOTE — ED Notes (Signed)
Bear hugger placed

## 2023-07-23 ENCOUNTER — Inpatient Hospital Stay (HOSPITAL_COMMUNITY): Payer: 59

## 2023-07-23 DIAGNOSIS — R4182 Altered mental status, unspecified: Secondary | ICD-10-CM

## 2023-07-23 DIAGNOSIS — E440 Moderate protein-calorie malnutrition: Secondary | ICD-10-CM | POA: Insufficient documentation

## 2023-07-23 DIAGNOSIS — J9601 Acute respiratory failure with hypoxia: Secondary | ICD-10-CM

## 2023-07-23 DIAGNOSIS — R7989 Other specified abnormal findings of blood chemistry: Secondary | ICD-10-CM

## 2023-07-23 DIAGNOSIS — R569 Unspecified convulsions: Secondary | ICD-10-CM

## 2023-07-23 DIAGNOSIS — Z9911 Dependence on respirator [ventilator] status: Secondary | ICD-10-CM

## 2023-07-23 DIAGNOSIS — J690 Pneumonitis due to inhalation of food and vomit: Secondary | ICD-10-CM

## 2023-07-23 DIAGNOSIS — T50904A Poisoning by unspecified drugs, medicaments and biological substances, undetermined, initial encounter: Secondary | ICD-10-CM | POA: Diagnosis not present

## 2023-07-23 HISTORY — DX: Moderate protein-calorie malnutrition: E44.0

## 2023-07-23 LAB — BLOOD GAS, ARTERIAL
Acid-base deficit: 2.2 mmol/L — ABNORMAL HIGH (ref 0.0–2.0)
Bicarbonate: 23.7 mmol/L (ref 20.0–28.0)
O2 Saturation: 98.9 %
Patient temperature: 37
pCO2 arterial: 44 mm[Hg] (ref 32–48)
pH, Arterial: 7.34 — ABNORMAL LOW (ref 7.35–7.45)
pO2, Arterial: 102 mm[Hg] (ref 83–108)

## 2023-07-23 LAB — CK
Total CK: 5938 U/L — ABNORMAL HIGH (ref 49–397)
Total CK: 5132 U/L — ABNORMAL HIGH (ref 49–397)

## 2023-07-23 LAB — MRSA NEXT GEN BY PCR, NASAL: MRSA by PCR Next Gen: NOT DETECTED

## 2023-07-23 LAB — CBC
HCT: 42.4 % (ref 39.0–52.0)
HCT: 43.5 % (ref 39.0–52.0)
Hemoglobin: 14.2 g/dL (ref 13.0–17.0)
Hemoglobin: 14.4 g/dL (ref 13.0–17.0)
MCH: 28.1 pg (ref 26.0–34.0)
MCH: 28.3 pg (ref 26.0–34.0)
MCHC: 33.1 g/dL (ref 30.0–36.0)
MCHC: 33.5 g/dL (ref 30.0–36.0)
MCV: 84.5 fL (ref 80.0–100.0)
MCV: 85 fL (ref 80.0–100.0)
Platelets: 171 10*3/uL (ref 150–400)
Platelets: 175 10*3/uL (ref 150–400)
RBC: 5.02 MIL/uL (ref 4.22–5.81)
RBC: 5.12 MIL/uL (ref 4.22–5.81)
RDW: 14 % (ref 11.5–15.5)
RDW: 14.2 % (ref 11.5–15.5)
WBC: 11.6 10*3/uL — ABNORMAL HIGH (ref 4.0–10.5)
WBC: 11.9 10*3/uL — ABNORMAL HIGH (ref 4.0–10.5)
nRBC: 0 % (ref 0.0–0.2)
nRBC: 0 % (ref 0.0–0.2)

## 2023-07-23 LAB — COMPREHENSIVE METABOLIC PANEL
ALT: 57 U/L — ABNORMAL HIGH (ref 0–44)
AST: 94 U/L — ABNORMAL HIGH (ref 15–41)
Albumin: 3.1 g/dL — ABNORMAL LOW (ref 3.5–5.0)
Alkaline Phosphatase: 44 U/L (ref 38–126)
Anion gap: 10 (ref 5–15)
BUN: 20 mg/dL (ref 6–20)
CO2: 22 mmol/L (ref 22–32)
Calcium: 8.9 mg/dL (ref 8.9–10.3)
Chloride: 107 mmol/L (ref 98–111)
Creatinine, Ser: 1.55 mg/dL — ABNORMAL HIGH (ref 0.61–1.24)
GFR, Estimated: >60 mL/min (ref >60)
Glucose, Bld: 80 mg/dL (ref 70–99)
Potassium: 4.2 mmol/L (ref 3.5–5.1)
Sodium: 139 mmol/L (ref 135–145)
Total Bilirubin: 0.7 mg/dL (ref 0.3–1.2)
Total Protein: 5.5 g/dL — ABNORMAL LOW (ref 6.5–8.1)

## 2023-07-23 LAB — ECHOCARDIOGRAM COMPLETE
Area-P 1/2: 2.87 cm2
Height: 69.016 in
S' Lateral: 3.6 cm
Single Plane A4C EF: 44.9 %
Weight: 2324.53 [oz_av]

## 2023-07-23 LAB — GLUCOSE, CAPILLARY
Glucose-Capillary: 100 mg/dL — ABNORMAL HIGH (ref 70–99)
Glucose-Capillary: 101 mg/dL — ABNORMAL HIGH (ref 70–99)
Glucose-Capillary: 104 mg/dL — ABNORMAL HIGH (ref 70–99)
Glucose-Capillary: 105 mg/dL — ABNORMAL HIGH (ref 70–99)
Glucose-Capillary: 107 mg/dL — ABNORMAL HIGH (ref 70–99)
Glucose-Capillary: 70 mg/dL (ref 70–99)

## 2023-07-23 LAB — TROPONIN I (HIGH SENSITIVITY)
Troponin I (High Sensitivity): 258 ng/L (ref <18)
Troponin I (High Sensitivity): 437 ng/L (ref <18)
Troponin I (High Sensitivity): 405 ng/L (ref <18)
Troponin I (High Sensitivity): 443 ng/L (ref <18)

## 2023-07-23 LAB — PHOSPHORUS: Phosphorus: 2.8 mg/dL (ref 2.5–4.6)

## 2023-07-23 LAB — LACTIC ACID, PLASMA
Lactic Acid, Venous: 3.2 mmol/L (ref 0.5–1.9)
Lactic Acid, Venous: 1.6 mmol/L (ref 0.5–1.9)

## 2023-07-23 LAB — HIV ANTIBODY (ROUTINE TESTING W REFLEX): HIV Screen 4th Generation wRfx: NONREACTIVE

## 2023-07-23 LAB — TRIGLYCERIDES: Triglycerides: 47 mg/dL (ref <150)

## 2023-07-23 LAB — PROCALCITONIN: Procalcitonin: 1.5 ng/mL

## 2023-07-23 LAB — MAGNESIUM: Magnesium: 1.8 mg/dL (ref 1.7–2.4)

## 2023-07-23 MED ORDER — THIAMINE MONONITRATE 100 MG PO TABS
100.0000 mg | ORAL_TABLET | Freq: Every day | ORAL | Status: DC
  Administered 2023-07-23 – 2023-07-24 (×2): 100 mg
  Filled 2023-07-23 (×2): qty 1

## 2023-07-23 MED ORDER — VITAL HIGH PROTEIN PO LIQD: 1000.0000 mL | ORAL | Status: DC

## 2023-07-23 MED ORDER — PROSOURCE TF20 ENFIT COMPATIBL EN LIQD
60.0000 mL | Freq: Every day | ENTERAL | Status: DC
  Administered 2023-07-23 – 2023-07-24 (×2): 60 mL
  Filled 2023-07-23 (×2): qty 60

## 2023-07-23 MED ORDER — BUPRENORPHINE HCL-NALOXONE HCL 8-2 MG SL SUBL
1.0000 | SUBLINGUAL_TABLET | Freq: Two times a day (BID) | SUBLINGUAL | Status: DC
  Administered 2023-07-23 (×2): 1 via SUBLINGUAL
  Filled 2023-07-23 (×2): qty 1

## 2023-07-23 MED ORDER — BUPRENORPHINE HCL-NALOXONE HCL 8-2 MG SL SUBL: 1.0000 | SUBLINGUAL_TABLET | Freq: Three times a day (TID) | SUBLINGUAL | Status: DC | PRN

## 2023-07-23 MED ORDER — DEXTROSE IN LACTATED RINGERS 5 % IV SOLN: INTRAVENOUS | Status: DC

## 2023-07-23 MED ORDER — OSMOLITE 1.5 CAL PO LIQD
1000.0000 mL | ORAL | Status: DC
  Administered 2023-07-23: 1000 mL
  Filled 2023-07-23: qty 1000

## 2023-07-23 MED ORDER — LACTATED RINGERS IV BOLUS
500.0000 mL | Freq: Once | INTRAVENOUS | Status: AC
  Administered 2023-07-23: 500 mL via INTRAVENOUS

## 2023-07-23 MED ORDER — DEXTROSE-SODIUM CHLORIDE 5-0.9 % IV SOLN: INTRAVENOUS | Status: DC

## 2023-07-23 NOTE — Progress Notes (Signed)
EEG complete - results pending 

## 2023-07-23 NOTE — Progress Notes (Signed)
Afternoon rounds: Family updated at bedside. Patient is off prop/fentanyl. On PS/CPAP most of day without difficulty. Should continue this overnight if he tolerates it. Waiting for him to wake up to be able to extubate. Started on Suboxone to hopefully avoid severe withdrawal symptoms as we are approaching 48-72 hour mark. Got echo today. Results pending. Trop peaked. Hopefully extubated either later today/tonight or early AM tomorrow.

## 2023-07-23 NOTE — Progress Notes (Signed)
  Echocardiogram 2D Echocardiogram has been performed.  Donald Marshall 07/23/2023, 2:30 PM

## 2023-07-23 NOTE — Procedures (Signed)
Patient Name: PIERCEN COVINO  MRN: [id number]  Epilepsy Attending: Charlsie Quest  Referring Physician/Provider: Chilton Greathouse, MD  Date: 07/23/2023 Duration: 25.46 mins  Patient history: 28yo M with ams getting eeg to evaluate for seizure  Level of alertness:  comatose  AEDs during EEG study: Propofol  Technical aspects: This EEG study was done with scalp electrodes positioned according to the 10-20 International system of electrode placement. Electrical activity was reviewed with band pass filter of 1-70Hz , sensitivity of 7 uV/mm, display speed of 33mm/sec with a 60Hz  notched filter applied as appropriate. EEG data were recorded continuously and digitally stored.  Video monitoring was available and reviewed as appropriate.  Description: EEG showed continuous generalized predominantly 5-8Hz  theta -alpha activity admixed with intermittent 2-3Hz  delta slowing. Physiologic photic driving was not seen during photic stimulation.  Hyperventilation was not performed.     ABNORMALITY - Continuous slow, generalized  IMPRESSION: This study is suggestive of moderate diffuse encephalopathy. No seizures or epileptiform discharges were seen throughout the recording.  Evalynn Hankins Annabelle Harman

## 2023-07-23 NOTE — Progress Notes (Signed)
NAME:  Donald Marshall, MRN:  [id number], DOB:  1996-09-15, LOS: 1 ADMISSION DATE:  07/22/2023, CONSULTATION DATE:  07/22/2023  REFERRING MD:  EDP, CHIEF COMPLAINT:  overdose    History of Present Illness:  27 year old male with past medical history of asthma, polysubstance use disorder who initially presented to [facility] emergency department with altered mental status. Per EDP note, patient arrived via EMS unresponsive. Girlfriend called EMS after patient had been unresponsive for about 2 hours. She had attempted giving him IN Narcan with no improvement in mental status. She noted he uses heroin and methamphetamines. EMS gave IM versed due to concerns for posturing vs seizure activity. In the ED, patient intubated. Concern for myoclonic jerking. Labs notable for K 7.1, Cr 2.79, AST/ALT 62/59, WBC 16.4, UDS + cocaine, benzos, amphetamines, THC. ABG pCO2 29, CK 3908, troponin 61>78. Blood cultures drawn. CT head with no acute findings, CXR with LUL opacity. Patient given 2L IVF, K temporized with insulin, calcium gluconate, albuterol, and started on Ampicillin. Admitted to PCCM for ongoing care.  Pertinent  Medical History  Asthma, polysubstance use disorder   Significant Hospital Events: Including procedures, antibiotic start and stop dates in addition to other pertinent events   10/6: admitted to [facility]  10/7: follow EEG this AM, SBT depending on results. Started trickle tube feeds. Con't to trend trop, CK. Echo ordered.   Interim History / Subjective:  Patient unable to participate, as per HPI   Objective   Blood pressure 125/75, pulse 94, temperature 99.3 F (37.4 C), resp. rate 20, height 5' 9.02" (1.753 m), weight 65.9 kg, SpO2 100%.    Vent Mode: PRVC FiO2 (%):  [40 %-100 %] 40 % Set Rate:  [20 bmp-24 bmp] 20 bmp Vt Set:  [520 mL-560 mL] 560 mL PEEP:  [5 cmH20] 5 cmH20 Plateau Pressure:  [15 cmH20-18 cmH20] 18 cmH20   Intake/Output Summary (Last 24 hours) at 07/23/2023 0851 Last  data filed at 07/23/2023 0700 Gross per 24 hour  Intake 2725.65 ml  Output 1335 ml  Net 1390.65 ml   Filed Weights   07/22/23 2100 07/22/23 2300 07/23/23 0223  Weight: 71.1 kg 65.9 kg 65.9 kg    Examination: General: young male, intubated, sedated, in no acute distress  HENT: OGT, ETT. Mucous membranes moist. Pupils 2mm, reactive.  Lungs: right clear, left lower lung fields diminished, rhonchi. no wheezing or rales Cardiovascular: s1/s2 without murmur, rub, gallop Abdomen: soft, +BS  Extremities: no peripheral edema Neuro: RASS -3 to -4. Withdraws to pain  GU: foley  Resolved Hospital Problem list     Assessment & Plan:  Acute respiratory failure  ?aspiration pneumonia; LUL opacity now with worsening LLL haziness. Likely had aspiration event during overdose.  - con't unasyn  - follow cbc, trach aspirate culture  - maintain vent support @ 6-8cc/kg Vt ideal body weight  - wean settings as able to keep O2 >92%  - daily SAT/SBT - attempt SBT today if EEG shows no seizure activity   Polysubstance abuse; narcan given in ED with no improvement  Altered mental status ; ct head negative, UDS+meth/heroin/cocaine/benzo/THC Acute metabolic/toxic encephalopathy; ammonia normal  - supportive care  - EEG complete - waiting read by neurology - wean prop/fent to assess mental status   Elevated troponin; [id number]. Stress induced?  - repeat EKG this AM without acute changes  - con't trend troponin  - echo limited  - consult cards if continues to trend or EKG changes   Acute kidney  injury  Hyperkalemia  Rhabdomyolysis CK 1610>9604 - trend CK to peak  - trend renal function, K; Cr improving this AM  - con't IVF for clearance  - monitor UOP  - calcium, insulin/dextrose, lokelma   Best Practice (right click and "Reselect all SmartList Selections" daily)   Diet/type: tubefeeds DVT prophylaxis: prophylactic heparin  GI prophylaxis: H2B Lines: N/A Foley:  Yes, and it is still  needed Code Status:  full code Last date of multidisciplinary goals of care discussion [updated sister at bedside this morning]  Labs   CBC: Recent Labs  Lab 07/22/23 1822 07/23/23 0043 07/23/23 0320  WBC 16.4* 11.9* 11.6*  NEUTROABS 13.6*  --   --   HGB 16.3 14.4 14.2  HCT 52.7* 43.5 42.4  MCV 91.2 85.0 84.5  PLT 199 175 171    Basic Metabolic Panel: Recent Labs  Lab 07/22/23 1822 07/22/23 1955 07/23/23 0320  NA 139 136 139  K 7.1* 6.8* 4.2  CL 102 104 107  CO2 20* 18* 22  GLUCOSE 101* 99 80  BUN 24* 25* 20  CREATININE 2.79* 2.50* 1.55*  CALCIUM 8.5* 8.2* 8.9  MG  --   --  1.8   GFR: Estimated Creatinine Clearance: 66.1 mL/min (A) (by C-G formula based on SCr of 1.55 mg/dL (H)). Recent Labs  Lab 07/22/23 1822 07/23/23 0043 07/23/23 0320  PROCALCITON  --  1.50  --   WBC 16.4* 11.9* 11.6*  LATICACIDVEN  --  3.2* 1.6    Liver Function Tests: Recent Labs  Lab 07/22/23 1822 07/23/23 0320  AST 62* 94*  ALT 59* 57*  ALKPHOS 61 44  BILITOT 0.6 0.7  PROT 7.3 5.5*  ALBUMIN 4.3 3.1*   No results for input(s): "LIPASE", "AMYLASE" in the last 168 hours. Recent Labs  Lab 07/22/23 1926  AMMONIA 15    ABG    Component Value Date/Time   PHART 7.34 (L) 07/22/2023 2340   PCO2ART 44 07/22/2023 2340   PO2ART 102 07/22/2023 2340   HCO3 23.7 07/22/2023 2340   ACIDBASEDEF 2.2 (H) 07/22/2023 2340   O2SAT 98.9 07/22/2023 2340     Coagulation Profile: Recent Labs  Lab 07/22/23 1926  INR 1.1    Cardiac Enzymes: Recent Labs  Lab 07/22/23 1926 07/23/23 0320  CKTOTAL 3,908* 5,938*    HbA1C: No results found for: "HGBA1C"  CBG: Recent Labs  Lab 07/22/23 1800 07/22/23 1934 07/22/23 2253 07/23/23 0337 07/23/23 0734  GLUCAP 96 92 106* 70 104*    Review of Systems:   As per HPI   Past Medical History:  He,  has a past medical history of Asthma and Herpes genitalis in men.   Surgical History:   Past Surgical History:  Procedure Laterality  Date   APPENDECTOMY     FRACTURE SURGERY       Social History:   reports that he has been smoking cigarettes. He does not have any smokeless tobacco history on file. He reports current alcohol use. He reports current drug use. Drug: Marijuana.   Family History:  His family history includes Asthma in an other family member; COPD in his mother; Cancer in an other family member; Heart failure in an other family member; Hypertension in his mother; Kidney failure in his mother.   Allergies Allergies  Allergen Reactions   Hydrocodone Swelling   Sulfa Antibiotics Itching   Tramadol Itching     Home Medications  Prior to Admission medications   Medication Sig Start Date End Date  Taking? Authorizing Provider  Buprenorphine HCl-Naloxone HCl 8-2 MG FILM Place 1 Film under the tongue 2 (two) times daily. 06/28/23  Yes [provider]  oxyCODONE-acetaminophen (PERCOCET/ROXICET) 5-325 MG tablet Take 1 tablet by mouth every 4 (four) hours as needed. 10/24/22  Yes Kommor, Madison, MD  naloxone [facility]) nasal spray 4 mg/0.1 mL Place 1 spray into the nose once. 07/22/23   [provider]     Critical care time: 35    [facility] 07/23/2023, 8:51 AM  Please see [email or web address] for pager details.  From 7A-7P if no response, please call [phone]. After hours, please call ELink [phone].

## 2023-07-23 NOTE — Progress Notes (Signed)
Initial Nutrition Assessment  DOCUMENTATION CODES:  Non-severe (moderate) malnutrition in context of social or environmental circumstances  INTERVENTION:  Initiate tube feeding via OGT: Osmolite 1.5 at 60 ml/h (1440 ml per day) Start at 20 and hold. When ok to advance per CCM, advance by 10 q8h to goal Prosource TF20 60 ml 1x/d Provides 2240 kcal, 110 gm protein, 1097 ml free water daily Thiamine 100mg , folic acid 1mg , MVI with minerals daily  NUTRITION DIAGNOSIS:  Moderate Malnutrition related to social / environmental circumstances (polysubstance abuse) as evidenced by mild muscle depletion, moderate fat depletion.  GOAL:   Patient will meet greater than or equal to 90% of their needs  MONITOR:   TF tolerance, I & O's, Vent status, Labs, Weight trends  REASON FOR ASSESSMENT:   Consult Enteral/tube feeding initiation and management (trickle)  ASSESSMENT:   Pt with hx of IV drug use presented to ED as a drug overdose after being unresponsive at home. Concern for seizure activity when EMS arrived.  10/6 - presented to [facility] ED, intubated and transferred to [facility]   Patient is currently intubated on ventilator support. OGT in place, gastric on imaging. Trickle TF ordered per CCM. Hopeful to extubate either later today or in the AM. Adjusted formula to better meet needs if full feeds are needed.   Family in room at the time of assessment states that they have noticed weight loss and that pt's appetite is not consistent. Some days will eat a lot and other not at all. Reports that on days he eats a lot, diet is not good. No meals, just a lot of snacks all day. Muscle and fat deficits noted on exam.  MV: 11.2 L/min Temp (24hrs), Avg:99.2 F (37.3 C), Min:93.6 F (34.2 C), Max:100.4 F (38 C)  Admit weight: 71.1 kg  Current weight: 65.9 kg  ? Accuracy of weight hx. However, noted a 19.2% weight loss over the last 3 months if accurate which is severe.     Intake/Output Summary (Last 24 hours) at 07/23/2023 1645 Last data filed at 07/23/2023 1200 Gross per 24 hour  Intake 3530.5 ml  Output 1335 ml  Net 2195.5 ml  Net IO Since Admission: 2,195.5 mL [07/23/23 1645]  Nutritionally Relevant Medications: Scheduled Meds:  docusate  100 mg Per Tube BID   famotidine  20 mg Per Tube BID   VITAL HIGH PROTEIN  1,000 mL Per Tube Q24H   folic acid  1 mg Per Tube Daily   multivitamin with minerals  1 tablet Per Tube Daily   polyethylene glycol  17 g Per Tube Daily   thiamine  100 mg Per Tube Daily   Continuous Infusions:  ampicillin-sulbactam (UNASYN) IV 3 g (07/23/23 1305)   PRN Meds: docusate, polyethylene glycol  Labs Reviewed: Creatinine 1.55 CBG ranges from 70-107 mg/dL over the last 24 hours  NUTRITION - FOCUSED PHYSICAL EXAM: Flowsheet Row Most Recent Value  Orbital Region Mild depletion  Upper Arm Region Moderate depletion  Thoracic and Lumbar Region Mild depletion  Buccal Region Moderate depletion  Temple Region Mild depletion  Clavicle Bone Region Mild depletion  Clavicle and Acromion Bone Region Mild depletion  Scapular Bone Region No depletion  Dorsal Hand Unable to assess  [mittens]  Patellar Region Moderate depletion  Anterior Thigh Region Mild depletion  Posterior Calf Region Mild depletion  Edema (RD Assessment) None  Hair Reviewed  Eyes Reviewed  Mouth Reviewed  Skin Reviewed  Nails Unable to assess  Diet Order:   Diet Order             Diet NPO time specified Except for: Other (See Comments)  Diet effective now                   EDUCATION NEEDS:  Not appropriate for education at this time  Skin:  Skin Assessment: Reviewed RN Assessment  Last BM:  unsure  Height:  Ht Readings from Last 1 Encounters:  07/22/23 5' 9.02" (1.753 m)    Weight:  Wt Readings from Last 1 Encounters:  07/23/23 65.9 kg    Ideal Body Weight:  72.7 kg  BMI:  Body mass index is 21.44 kg/m.  Estimated  Nutritional Needs:  Kcal:  2200-2400 kcal/d Protein:  110-130g/d Fluid:  2.2-2.4L/d    Greig Castilla, RD, LDN Clinical Dietitian RD pager # available in [facility]  After hours/weekend pager # available in [facility]

## 2023-07-24 LAB — GLUCOSE, CAPILLARY
Glucose-Capillary: 107 mg/dL — ABNORMAL HIGH (ref 70–99)
Glucose-Capillary: 109 mg/dL — ABNORMAL HIGH (ref 70–99)
Glucose-Capillary: 89 mg/dL (ref 70–99)
Glucose-Capillary: 72 mg/dL (ref 70–99)
Glucose-Capillary: 75 mg/dL (ref 70–99)
Glucose-Capillary: 113 mg/dL — ABNORMAL HIGH (ref 70–99)

## 2023-07-24 LAB — BASIC METABOLIC PANEL
Anion gap: 10 (ref 5–15)
BUN: 14 mg/dL (ref 6–20)
CO2: 25 mmol/L (ref 22–32)
Calcium: 8.4 mg/dL — ABNORMAL LOW (ref 8.9–10.3)
Chloride: 101 mmol/L (ref 98–111)
Creatinine, Ser: 0.89 mg/dL (ref 0.61–1.24)
GFR, Estimated: >60 mL/min (ref >60)
Glucose, Bld: 89 mg/dL (ref 70–99)
Potassium: 3.8 mmol/L (ref 3.5–5.1)
Sodium: 136 mmol/L (ref 135–145)

## 2023-07-24 LAB — URINE CULTURE: Culture: NO GROWTH

## 2023-07-24 LAB — CBC
HCT: 41.1 % (ref 39.0–52.0)
Hemoglobin: 13.1 g/dL (ref 13.0–17.0)
MCH: 27 pg (ref 26.0–34.0)
MCHC: 31.9 g/dL (ref 30.0–36.0)
MCV: 84.7 fL (ref 80.0–100.0)
Platelets: 160 10*3/uL (ref 150–400)
RBC: 4.85 MIL/uL (ref 4.22–5.81)
RDW: 14.7 % (ref 11.5–15.5)
WBC: 10.5 10*3/uL (ref 4.0–10.5)
nRBC: 0 % (ref 0.0–0.2)

## 2023-07-24 LAB — MAGNESIUM
Magnesium: 2 mg/dL (ref 1.7–2.4)
Magnesium: 2.1 mg/dL (ref 1.7–2.4)

## 2023-07-24 LAB — PHOSPHORUS
Phosphorus: 2.1 mg/dL — ABNORMAL LOW (ref 2.5–4.6)
Phosphorus: 2.5 mg/dL (ref 2.5–4.6)

## 2023-07-24 MED ORDER — POLYETHYLENE GLYCOL 3350 17 G PO PACK
17.0000 g | PACK | Freq: Every day | ORAL | Status: DC
  Administered 2023-07-25: 17 g via ORAL
  Filled 2023-07-24: qty 1

## 2023-07-24 MED ORDER — FOLIC ACID 1 MG PO TABS
1.0000 mg | ORAL_TABLET | Freq: Every day | ORAL | Status: DC
  Administered 2023-07-25: 1 mg via ORAL
  Filled 2023-07-24: qty 1

## 2023-07-24 MED ORDER — THIAMINE MONONITRATE 100 MG PO TABS
100.0000 mg | ORAL_TABLET | Freq: Every day | ORAL | Status: DC
  Administered 2023-07-25: 100 mg via ORAL
  Filled 2023-07-24: qty 1

## 2023-07-24 MED ORDER — BUPRENORPHINE HCL-NALOXONE HCL 2-0.5 MG SL SUBL: 2.0000 | SUBLINGUAL_TABLET | Freq: Every day | SUBLINGUAL | Status: DC

## 2023-07-24 MED ORDER — ADULT MULTIVITAMIN W/MINERALS CH
1.0000 | ORAL_TABLET | Freq: Every day | ORAL | Status: DC
  Administered 2023-07-25: 1 via ORAL
  Filled 2023-07-24: qty 1

## 2023-07-24 MED ORDER — DOCUSATE SODIUM 100 MG PO CAPS
100.0000 mg | ORAL_CAPSULE | Freq: Two times a day (BID) | ORAL | Status: DC
  Administered 2023-07-25: 100 mg via ORAL
  Filled 2023-07-24: qty 1

## 2023-07-24 MED ORDER — DOCUSATE SODIUM 100 MG PO CAPS: 100.0000 mg | ORAL_CAPSULE | Freq: Two times a day (BID) | ORAL | Status: DC | PRN

## 2023-07-24 MED ORDER — POLYETHYLENE GLYCOL 3350 17 G PO PACK: 17.0000 g | PACK | Freq: Every day | ORAL | Status: DC | PRN

## 2023-07-24 MED ORDER — ENSURE ENLIVE PO LIQD
237.0000 mL | Freq: Three times a day (TID) | ORAL | Status: DC
  Administered 2023-07-24: 237 mL via ORAL

## 2023-07-24 MED ORDER — BUPRENORPHINE HCL-NALOXONE HCL 2-0.5 MG SL SUBL
2.0000 | SUBLINGUAL_TABLET | Freq: Two times a day (BID) | SUBLINGUAL | Status: DC
  Filled 2023-07-24: qty 2

## 2023-07-24 MED ORDER — K PHOS MONO-SOD PHOS DI & MONO 155-852-130 MG PO TABS
500.0000 mg | ORAL_TABLET | Freq: Once | ORAL | Status: AC
  Administered 2023-07-24: 500 mg via ORAL
  Filled 2023-07-24: qty 2

## 2023-07-24 MED ORDER — ENOXAPARIN SODIUM 40 MG/0.4ML IJ SOSY
40.0000 mg | PREFILLED_SYRINGE | INTRAMUSCULAR | Status: DC
  Administered 2023-07-24: 40 mg via SUBCUTANEOUS
  Filled 2023-07-24 (×2): qty 0.4

## 2023-07-24 NOTE — Evaluation (Signed)
Physical Therapy Evaluation Patient Details Name: Donald Marshall MRN: [id number] DOB: [birth year] Today's Date: 07/24/2023  History of Present Illness  27 yo male admitted 10/6 with AMS after unresponsive 2hrs at home s/p narcan. Intubated in ED. 10/8 Extubated. UDS + cocaine, benzos, amphetamines, THC. 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 Pt with decreased attention, orientation, safety, balance and mobility who will benefit from acute therapy to maximize mobility and safety to decrease burden of care. Pt reports he lives with girlfriend who can assist at d/C. Encouraged OOB daily with nursing assist.     HR 62 SPO2 95% on RA 109/68 seated      If plan is discharge home, recommend the following: Assistance with cooking/housework;Assist for transportation;Supervision due to cognitive status;Direct supervision/assist for medications management   Can travel by private vehicle        Equipment Recommendations None recommended by PT  Recommendations for Other Services       Functional Status Assessment Patient has had a recent decline in their functional status and demonstrates the ability to make significant improvements in function in a reasonable and predictable amount of time.     Precautions / Restrictions Precautions Precautions: Fall;Other (comment) Precaution Comments: legally blind per sister      Mobility  Bed Mobility Overal bed mobility: Needs Assistance Bed Mobility: Supine to Sit     Supine to sit: Contact guard, HOB elevated     General bed mobility comments: HOB 20 degrees with CGA for lines and safety    Transfers Overall transfer level: Needs assistance   Transfers: Sit to/from Stand Sit to Stand: Min assist            General transfer comment: initial stand min assist due to pt with posterior right lean in standing, pt able to correct with RUE support on rail and then gain balance    Ambulation/Gait Ambulation/Gait assistance: Min assist Gait Distance (Feet): 300 Feet Assistive device: None Gait Pattern/deviations: Step-through pattern, Decreased stride length   Gait velocity interpretation: 1.31 - 2.62 ft/sec, indicative of limited community ambulator   General Gait Details: cues for direction, obstacles and min assist due to mild posterior right bias  Stairs            Wheelchair Mobility     Tilt Bed    Modified Rankin (Stroke Patients Only)       Balance Overall balance assessment: Needs assistance   Sitting balance-Leahy Scale: Good     Standing balance support: No upper extremity supported Standing balance-Leahy Scale: Fair Standing balance comment: static stand CGA, min assist with gait                             Pertinent Vitals/Pain Pain Assessment Pain Assessment: No/denies pain    Home Living Family/patient expects to be discharged to:: Private residence Living Arrangements: Spouse/significant other Available Help at Discharge: Friend(s);Available 24 hours/day Type of Home: House Home Access: Level entry       Home Layout: One level Home Equipment: None Additional Comments: legally blind    Prior Function Prior Level of Function : Independent/Modified Independent             Mobility Comments: doesn't drive but independent, works  doing side jobs       Extremity/Trunk Assessment   Upper Extremity Assessment Upper Extremity Assessment: Overall WFL for tasks assessed    Lower Extremity Assessment Lower Extremity Assessment: Overall WFL for tasks assessed    Cervical / Trunk Assessment Cervical / Trunk Assessment: Normal  Communication   Communication Communication: No apparent difficulties  Cognition Arousal:  Lethargic Behavior During Therapy: Flat affect Overall Cognitive Status: Impaired/Different from baseline Area of Impairment: Orientation, Problem solving                 Orientation Level: Disoriented to, Time           Problem Solving: Slow processing General Comments: pt stating month as Sept. lethargic needing frequent cueing to maintain attention        General Comments      Exercises     Assessment/Plan    PT Assessment Patient needs continued PT services  PT Problem List Decreased mobility;Decreased activity tolerance;Decreased cognition;Decreased balance;Decreased coordination       PT Treatment Interventions DME instruction;Gait training;Stair training;Functional mobility training;Therapeutic activities;Patient/family education;Cognitive remediation;Neuromuscular re-education;Balance training;Therapeutic exercise    PT Goals (Current goals can be found in the Care Plan section)  Acute Rehab PT Goals Patient Stated Goal: go home, get something to drink PT Goal Formulation: With patient/family Time For Goal Achievement: 08/07/23 Potential to Achieve Goals: Good    Frequency Min 1X/week     Co-evaluation               AM-PAC PT "6 Clicks" Mobility  Outcome Measure Help needed turning from your back to your side while in a flat bed without using bedrails?: A Little Help needed moving from lying on your back to sitting on the side of a flat bed without using bedrails?: A Little Help needed moving to and from a bed to a chair (including a wheelchair)?: A Little Help needed standing up from a chair using your arms (e.g., wheelchair or bedside chair)?: A Little Help needed to walk in hospital room?: A Little Help needed climbing 3-5 steps with a railing? : A Lot 6 Click Score: 17    End of Session Equipment Utilized During Treatment: Gait belt Activity Tolerance: Patient tolerated treatment well Patient left: in chair;with call bell/phone within  reach;with family/visitor present Nurse Communication: Mobility status PT Visit Diagnosis: Other abnormalities of gait and mobility (R26.89)    Time: 1253-1316 PT Time Calculation (min) (ACUTE ONLY): 23 min   Charges:   PT Evaluation $PT Eval Moderate Complexity: 1 Mod PT Treatments $Gait Training: 8-22 mins PT General Charges $$ ACUTE PT VISIT: 1 Visit         Merryl Hacker, PT Acute Rehabilitation Services Office: [phone]   Enedina Finner Thetis Schwimmer 07/24/2023, 1:26 PM

## 2023-07-24 NOTE — Progress Notes (Signed)
Nutrition Follow-up  DOCUMENTATION CODES:   Non-severe (moderate) malnutrition in context of social or environmental circumstances  INTERVENTION:   - Ensure Enlive po TID, each supplement provides 350 kcal and 20 grams of protein  - Continue MVI with minerals, folic acid, and thiamine  NUTRITION DIAGNOSIS:   Moderate Malnutrition related to social / environmental circumstances (polysubstance abuse) as evidenced by mild muscle depletion, moderate fat depletion.  Ongoing, being addressed via diet advancement and oral nutrition supplements  GOAL:   Patient will meet greater than or equal to 90% of their needs  Progressing  MONITOR:   PO intake, Supplement acceptance, Diet advancement, Weight trends  REASON FOR ASSESSMENT:   Consult Enteral/tube feeding initiation and management (trickle)  ASSESSMENT:   Pt with hx of IV drug use presented to ED as a drug overdose after being unresponsive at home. Concern for seizure activity when EMS arrived.  10/08 - extubated, diet advanced to full liquids  Discussed pt with RN and during ICU rounds. Pt extubated this morning. Diet advanced to full liquids. No meal completions charted yet. Will order oral nutrition supplements to aid pt in meeting kcal and protein needs while on full liquid diet.  Admit weight - 65.9 kg Current weight - 68 kg  Medications reviewed and include: colace, folic acid, MVI with minerals, K phos 500 mg x 1, miralax, thiamine, IV abx  Labs reviewed: phosphorus 2.1 CBG's: 72-109 x 24 hours  I/O's: +2.7 L since admit  Diet Order:   Diet Order             Diet full liquid Room service appropriate? Yes; Fluid consistency: Thin  Diet effective now                   EDUCATION NEEDS:   Not appropriate for education at this time  Skin:  Skin Assessment: Reviewed RN Assessment  Last BM:  no documented BM  Height:   Ht Readings from Last 1 Encounters:  07/22/23 5' 9.02" (1.753 m)    Weight:    Wt Readings from Last 1 Encounters:  07/24/23 68 kg    Ideal Body Weight:  72.7 kg  BMI:  Body mass index is 22.13 kg/m.  Estimated Nutritional Needs:   Kcal:  2200-2400 kcal/d  Protein:  110-130g/d  Fluid:  2.2-2.4L/d    Mertie Clause, MS, RD, LDN Registered Dietitian II Please see AMiON for contact information.

## 2023-07-24 NOTE — Progress Notes (Signed)
Afternoon rounds: Extubated on room air @ 97%. Sitting out of bed in chair. Intermittently drinking water so will put on clear diet for now. Still somewhat tired, which may be from the suboxone since he does not take prescribed amount. We halved dosing. To go to [facility] tomorrow. Transfer orders placed. Sister at bedside and I've updated her.

## 2023-07-24 NOTE — Procedures (Signed)
Extubation Procedure Note  Patient Details:   Name: SHERLOCK NANCARROW DOB: 11-Dec-1995 MRN: [id number]   Airway Documentation:  Airway 8 mm (Active)  Secured at (cm) 24 cm 07/24/23 0738  Measured From Lips 07/24/23 0738  Secured Location Left 07/24/23 0738  Secured By [employer] 07/24/23 0738  Tube Holder Repositioned Yes 07/24/23 0738  Prone position No 07/24/23 0347  Cuff Pressure (cm H2O) Green OR 18-26 [facility] 07/24/23 0738  Site Condition Cool;Dry 07/24/23 0738   Vent end date: 07/24/23 Vent end time: 0920   Evaluation  O2 sats: stable throughout Complications: No apparent complications Patient did tolerate procedure well. Bilateral Breath Sounds: Clear, Diminished   Yes, pt could speak post extubation.  Pt extubated to room air without difficulty.  Audrie Lia 07/24/2023, 9:20 AM

## 2023-07-24 NOTE — Progress Notes (Addendum)
NAME:  Donald Marshall, MRN:  [id number], DOB:  10/12/1996, LOS: 2 ADMISSION DATE:  07/22/2023, CONSULTATION DATE:  07/22/2023  REFERRING MD:  EDP, CHIEF COMPLAINT:  overdose    History of Present Illness:  27 year old male with past medical history of asthma, polysubstance use disorder who initially presented to [facility] emergency department with altered mental status. Per EDP note, patient arrived via EMS unresponsive. Girlfriend called EMS after patient had been unresponsive for about 2 hours. She had attempted giving him IN Narcan with no improvement in mental status. She noted he uses heroin and methamphetamines. EMS gave IM versed due to concerns for posturing vs seizure activity. In the ED, patient intubated. Concern for myoclonic jerking. Labs notable for K 7.1, Cr 2.79, AST/ALT 62/59, WBC 16.4, UDS + cocaine, benzos, amphetamines, THC. ABG pCO2 29, CK 3908, troponin 61>78. Blood cultures drawn. CT head with no acute findings, CXR with LUL opacity. Patient given 2L IVF, K temporized with insulin, calcium gluconate, albuterol, and started on Ampicillin. Admitted to PCCM for ongoing care.  Pertinent  Medical History  Asthma, polysubstance use disorder   Significant Hospital Events: Including procedures, antibiotic start and stop dates in addition to other pertinent events   10/6: admitted to [facility]  10/7: follow EEG this AM, SBT depending on results. Started trickle tube feeds. Con't to trend trop, CK. Echo ordered.  10/8: awake this morning following commands. On SBT with plan to extubate  Interim History / Subjective:  Patient unable to participate, as per HPI   Objective   Blood pressure 116/77, pulse 73, temperature 99.3 F (37.4 C), resp. rate 20, height 5' 9.02" (1.753 m), weight 68 kg, SpO2 100%.    Vent Mode: PRVC FiO2 (%):  [30 %-60 %] 30 % Set Rate:  [20 bmp] 20 bmp Vt Set:  [560 mL] 560 mL PEEP:  [5 cmH20] 5 cmH20 Pressure Support:  [8 cmH20-10 cmH20] 8 cmH20 Plateau  Pressure:  [14 cmH20-18 cmH20] 14 cmH20   Intake/Output Summary (Last 24 hours) at 07/24/2023 0701 Last data filed at 07/24/2023 0600 Gross per 24 hour  Intake 1844.85 ml  Output 650 ml  Net 1194.85 ml   Filed Weights   07/22/23 2300 07/23/23 0223 07/24/23 0500  Weight: 65.9 kg 65.9 kg 68 kg    Examination: General: young male, intubated, in NAD HENT: OGT, ETT. Mucous membranes moist. Pupils 2mm, reactive.  Lungs: right clear, left lower lung fields diminished, rhonchi. no wheezing or rales Cardiovascular: s1/s2 without murmur, rub, gallop Abdomen: soft, +BS  Extremities: no peripheral edema Neuro: RASS -1, moves all extremities well  GU: foley  Resolved Hospital Problem list   hyperkalemia  Assessment & Plan:  Acute respiratory failure  ?aspiration pneumonia; LUL opacity now with worsening LLL haziness. Likely had aspiration event during overdose.  - con't unasyn day 2 of 5  - follow cbc, trach aspirate with abundant GPC, waiting to speciate  - maintain vent support @ 6-8cc/kg Vt ideal body weight - SBT and likely extubate today - wean settings as able to keep O2 >92%   Polysubstance abuse; narcan given in ED with no improvement  Altered mental status ; ct head negative, UDS+meth/heroin/cocaine/benzo/THC Acute metabolic/toxic encephalopathy; ammonia normal  - started on Suboxone 10/7, sister says he does not take this medication as prescribed (takes less, breaks tablets in half due to somnolence). Will decreased to 4-1 dosing BID (half of current rx) and reassess once extubated. No signs of withdrawal at this time.  -  supportive care  - EEG complete - no seizure activity as noted my EMS.   Elevated troponin; 61>78>258>437>443>405 - likely stress induced. No ST changes on EKG  - echo yesterday EF 40-45% with LV mildly decreased function, global hypokinesis.  - consult cards if continues to trend or EKG changes   Acute kidney injury, resolving Hyperkalemia, resolved   Rhabdomyolysis  - trend renal function, K; Cr improving this AM - monitor UOP  - calcium, insulin/dextrose, lokelma as needed if hyperkalemic again   Best Practice (right click and "Reselect all SmartList Selections" daily)   Diet/type: NPO DVT prophylaxis: prophylactic heparin  GI prophylaxis: H2B Lines: N/A Foley:  Yes, and it is still needed Code Status:  full code Last date of multidisciplinary goals of care discussion [updated sister at bedside this morning]  Labs   CBC: Recent Labs  Lab 07/22/23 1822 07/23/23 0043 07/23/23 0320  WBC 16.4* 11.9* 11.6*  NEUTROABS 13.6*  --   --   HGB 16.3 14.4 14.2  HCT 52.7* 43.5 42.4  MCV 91.2 85.0 84.5  PLT 199 175 171    Basic Metabolic Panel: Recent Labs  Lab 07/22/23 1822 07/22/23 1955 07/23/23 0320 07/23/23 1802  NA 139 136 139  --   K 7.1* 6.8* 4.2  --   CL 102 104 107  --   CO2 20* 18* 22  --   GLUCOSE 101* 99 80  --   BUN 24* 25* 20  --   CREATININE 2.79* 2.50* 1.55*  --   CALCIUM 8.5* 8.2* 8.9  --   MG  --   --  1.8  --   PHOS  --   --   --  2.8   GFR: Estimated Creatinine Clearance: 68.2 mL/min (A) (by C-G formula based on SCr of 1.55 mg/dL (H)). Recent Labs  Lab 07/22/23 1822 07/23/23 0043 07/23/23 0320  PROCALCITON  --  1.50  --   WBC 16.4* 11.9* 11.6*  LATICACIDVEN  --  3.2* 1.6    Liver Function Tests: Recent Labs  Lab 07/22/23 1822 07/23/23 0320  AST 62* 94*  ALT 59* 57*  ALKPHOS 61 44  BILITOT 0.6 0.7  PROT 7.3 5.5*  ALBUMIN 4.3 3.1*   No results for input(s): "LIPASE", "AMYLASE" in the last 168 hours. Recent Labs  Lab 07/22/23 1926  AMMONIA 15    ABG    Component Value Date/Time   PHART 7.34 (L) 07/22/2023 2340   PCO2ART 44 07/22/2023 2340   PO2ART 102 07/22/2023 2340   HCO3 23.7 07/22/2023 2340   ACIDBASEDEF 2.2 (H) 07/22/2023 2340   O2SAT 98.9 07/22/2023 2340     Coagulation Profile: Recent Labs  Lab 07/22/23 1926  INR 1.1    Cardiac Enzymes: Recent Labs  Lab  07/22/23 1926 07/23/23 0320 07/23/23 1312  CKTOTAL 3,908* 5,938* 5,132*    HbA1C: No results found for: "HGBA1C"  CBG: Recent Labs  Lab 07/23/23 1158 07/23/23 1626 07/23/23 1951 07/23/23 2341 07/24/23 0331  GLUCAP 107* 100* 101* 105* 107*    Review of Systems:   As per HPI   Past Medical History:  He,  has a past medical history of Asthma and Herpes genitalis in men.   Surgical History:   Past Surgical History:  Procedure Laterality Date   APPENDECTOMY     FRACTURE SURGERY       Social History:   reports that he has been smoking cigarettes. He does not have any smokeless tobacco history on file.  He reports current alcohol use. He reports current drug use. Drug: Marijuana.   Family History:  His family history includes Asthma in an other family member; COPD in his mother; Cancer in an other family member; Heart failure in an other family member; Hypertension in his mother; Kidney failure in his mother.   Allergies Allergies  Allergen Reactions   Hydrocodone Swelling   Sulfa Antibiotics Itching   Tramadol Itching     Home Medications  Prior to Admission medications   Medication Sig Start Date End Date Taking? Authorizing Provider  Buprenorphine HCl-Naloxone HCl 8-2 MG FILM Place 1 Film under the tongue 2 (two) times daily. 06/28/23  Yes [provider]  oxyCODONE-acetaminophen (PERCOCET/ROXICET) 5-325 MG tablet Take 1 tablet by mouth every 4 (four) hours as needed. 10/24/22  Yes Kommor, [place], MD  naloxone [facility]) nasal spray 4 mg/0.1 mL Place 1 spray into the nose once. 07/22/23   [provider]     Critical care time: 35    [facility] 07/24/2023, 7:01 AM  Please see [email or web address] for pager details.  From 7A-7P if no response, please call [phone]. After hours, please call ELink [phone].

## 2023-07-25 ENCOUNTER — Other Ambulatory Visit (HOSPITAL_COMMUNITY): Payer: Self-pay

## 2023-07-25 ENCOUNTER — Other Ambulatory Visit: Payer: Self-pay

## 2023-07-25 DIAGNOSIS — E875 Hyperkalemia: Secondary | ICD-10-CM

## 2023-07-25 DIAGNOSIS — N179 Acute kidney failure, unspecified: Secondary | ICD-10-CM | POA: Diagnosis not present

## 2023-07-25 DIAGNOSIS — J9601 Acute respiratory failure with hypoxia: Secondary | ICD-10-CM | POA: Diagnosis not present

## 2023-07-25 DIAGNOSIS — J9690 Respiratory failure, unspecified, unspecified whether with hypoxia or hypercapnia: Secondary | ICD-10-CM | POA: Diagnosis not present

## 2023-07-25 LAB — BASIC METABOLIC PANEL
Anion gap: 8 (ref 5–15)
BUN: 13 mg/dL (ref 6–20)
CO2: 28 mmol/L (ref 22–32)
Calcium: 8.8 mg/dL — ABNORMAL LOW (ref 8.9–10.3)
Chloride: 103 mmol/L (ref 98–111)
Creatinine, Ser: 0.94 mg/dL (ref 0.61–1.24)
GFR, Estimated: >60 mL/min (ref >60)
Glucose, Bld: 80 mg/dL (ref 70–99)
Potassium: 4.1 mmol/L (ref 3.5–5.1)
Sodium: 139 mmol/L (ref 135–145)

## 2023-07-25 LAB — CBC
HCT: 42.5 % (ref 39.0–52.0)
Hemoglobin: 13.5 g/dL (ref 13.0–17.0)
MCH: 27.2 pg (ref 26.0–34.0)
MCHC: 31.8 g/dL (ref 30.0–36.0)
MCV: 85.7 fL (ref 80.0–100.0)
Platelets: 162 10*3/uL (ref 150–400)
RBC: 4.96 MIL/uL (ref 4.22–5.81)
RDW: 14.4 % (ref 11.5–15.5)
WBC: 5.8 10*3/uL (ref 4.0–10.5)
nRBC: 0 % (ref 0.0–0.2)

## 2023-07-25 LAB — GLUCOSE, CAPILLARY
Glucose-Capillary: 92 mg/dL (ref 70–99)
Glucose-Capillary: 84 mg/dL (ref 70–99)

## 2023-07-25 LAB — CK: Total CK: 1795 U/L — ABNORMAL HIGH (ref 49–397)

## 2023-07-25 LAB — CULTURE, RESPIRATORY W GRAM STAIN

## 2023-07-25 LAB — MAGNESIUM: Magnesium: 1.9 mg/dL (ref 1.7–2.4)

## 2023-07-25 LAB — PHOSPHORUS: Phosphorus: 3.3 mg/dL (ref 2.5–4.6)

## 2023-07-25 MED ORDER — NALOXONE HCL 4 MG/0.1ML NA LIQD
1.0000 | Freq: Once | NASAL | 0 refills | Status: AC
  Filled 2023-07-25: qty 2, 2d supply, fill #0

## 2023-07-25 MED ORDER — BUPRENORPHINE HCL-NALOXONE HCL 2-0.5 MG SL SUBL: 1.0000 | SUBLINGUAL_TABLET | Freq: Two times a day (BID) | SUBLINGUAL | Status: DC

## 2023-07-25 MED ORDER — BUPRENORPHINE HCL-NALOXONE HCL 2-0.5 MG SL SUBL
1.0000 | SUBLINGUAL_TABLET | Freq: Two times a day (BID) | SUBLINGUAL | Status: DC
  Administered 2023-07-25: 1 via SUBLINGUAL
  Filled 2023-07-25: qty 1

## 2023-07-25 MED ORDER — AMOXICILLIN-POT CLAVULANATE 875-125 MG PO TABS
1.0000 | ORAL_TABLET | Freq: Two times a day (BID) | ORAL | Status: DC
  Administered 2023-07-25: 1 via ORAL
  Filled 2023-07-25 (×2): qty 1

## 2023-07-25 MED ORDER — AMOXICILLIN-POT CLAVULANATE 875-125 MG PO TABS
1.0000 | ORAL_TABLET | Freq: Two times a day (BID) | ORAL | 0 refills | Status: AC
  Filled 2023-07-25: qty 10, 5d supply, fill #0

## 2023-07-25 MED ORDER — ADULT MULTIVITAMIN W/MINERALS CH
1.0000 | ORAL_TABLET | Freq: Every day | ORAL | 0 refills | Status: AC
  Filled 2023-07-25: qty 100, 100d supply, fill #0

## 2023-07-25 NOTE — Discharge Instructions (Signed)
1)Please follow-up as advised at [facility] in [place] # [phone]- on Wednesday, 07/26/2023 scheduled  2) complete abstinence from street drugs advised,  outpatient or inpatient drug rehab program advised---  3) please drink plenty fluids and avoid dehydration

## 2023-07-25 NOTE — Discharge Summary (Signed)
Donald Marshall, is a 27 y.o. male  DOB 04-25-1996  MRN [id number].  Admission date:  07/22/2023  Admitting Physician  Chilton Greathouse, MD  Discharge Date:  07/25/2023   Primary MD  Patient, No Pcp Per  Recommendations for primary care physician for things to follow:   1)Please follow-up as advised at [facility] in [place] # [phone]- on Wednesday, 07/26/2023 scheduled  2) complete abstinence from street drugs advised,  outpatient or inpatient drug rehab program advised---  3) please drink plenty fluids and avoid dehydration  Admission Diagnosis  Respiratory failure (HCC) [J96.90] Hyperkalemia [E87.5] Acute respiratory failure with hypoxia (HCC) [J96.01] Acute kidney injury (HCC) [N17.9] Drug overdose [T50.901A]   Discharge Diagnosis  Respiratory failure (HCC) [J96.90] Hyperkalemia [E87.5] Acute respiratory failure with hypoxia (HCC) [J96.01] Acute kidney injury (HCC) [N17.9] Drug overdose [T50.901A]    Principal Problem:   Respiratory failure (HCC) Active Problems:   Drug overdose   Acute kidney injury (HCC)   Hyperkalemia   Malnutrition of moderate degree     Past Medical History:  Diagnosis Date   Asthma    Herpes genitalis in men     Past Surgical History:  Procedure Laterality Date   APPENDECTOMY     FRACTURE SURGERY       HPI  from the history and physical done on the day of admission:   Patient is a 27 year old male presented to [facility] ED with AMS.   On 10/6 EMS called for patient with AMS.  On arrival patient unresponsive and soft BP.  Per girlfriend has been unresponsive for about 2 hours.  Girlfriend gave him intranasal Narcan with no change in mental status.  She believes he used heroin last night; methamphetamines.  EMS concern for possible posturing versus seizure and was given Versed.  Transferred to [facility] ED.   On ED arrival, patient GCS 4.   BP 89/48 and tachycardic 151.  Sats 85%.  Patient intubated.  Noted to be hypothermic 94 F and WBC 16.4.  Cultures obtained, given IV fluids, started on Unasyn.  CT head with no acute abnormality; LUL patchy ground glass opacities.  CBG 96.  Ethanol, ammonia, acetaminophen, salicylate level WNL.  UDS positive for cocaine, benzos, amphetamines, THC.  K7.1 and creat 2.79.  Given calcium, insulin/dextrose, albuterol. CK 3,908. PCCM consulted for icu admission and transfer to [facility].    Hospital Course:   Acute respiratory failure  ?aspiration pneumonia; LUL opacity now with worsening LLL haziness. Likely had aspiration event during overdose.  - con't unasyn day 2 of 5  - follow cbc, trach aspirate with abundant GPC, waiting to speciate  - maintain vent support @ 6-8cc/kg Vt ideal body weight - SBT and likely extubate today - wean settings as able to keep O2 >92%    Polysubstance abuse; narcan given in ED with no improvement  Altered mental status ; ct head negative, UDS+meth/heroin/cocaine/benzo/THC Acute metabolic/toxic encephalopathy; ammonia normal  - started on Suboxone 10/7, sister says he does not take this medication as prescribed (takes less,  breaks tablets in half due to somnolence). Will decreased to 4-1 dosing BID (half of current rx) and reassess once extubated. No signs of withdrawal at this time.  - supportive care  - EEG complete - no seizure activity as noted my EMS.    Elevated troponin; 61>78>258>437>443>405 - likely stress induced. No ST changes on EKG  - echo yesterday EF 40-45% with LV mildly decreased function, global hypokinesis.  - consult cards if continues to trend or EKG changes    Acute kidney injury, resolving Hyperkalemia, resolved  Rhabdomyolysis  - trend renal function, K; Cr improving this AM - monitor UOP  - calcium, insulin/dextrose, lokelma as needed if hyperkalemic again    Discharge Condition: ***  Follow UP     Consults obtained - ***  Diet and  Activity recommendation:  As advised  Discharge Instructions    **** Discharge Instructions     Call MD for:  difficulty breathing, headache or visual disturbances   Complete by: As directed    Call MD for:  persistant dizziness or light-headedness   Complete by: As directed    Call MD for:  persistant nausea and vomiting   Complete by: As directed    Call MD for:  temperature >100.4   Complete by: As directed    Diet general   Complete by: As directed    Discharge instructions   Complete by: As directed    1)Please follow-up as advised at [facility] in [place] # [phone]- on Wednesday, 07/26/2023 scheduled  2) complete abstinence from street drugs advised,  outpatient or inpatient drug rehab program advised---  3) please drink plenty fluids and avoid dehydration   Increase activity slowly   Complete by: As directed          Discharge Medications     Allergies as of 07/25/2023       Reactions   Hydrocodone Swelling   Sulfa Antibiotics Itching   Tramadol Itching        Medication List     STOP taking these medications    oxyCODONE-acetaminophen 5-325 MG tablet Commonly known as: PERCOCET/ROXICET       TAKE these medications    amoxicillin-clavulanate 875-125 MG tablet Commonly known as: AUGMENTIN Take 1 tablet by mouth 2 (two) times daily for 5 days.   Buprenorphine HCl-Naloxone HCl 8-2 MG Film Place 1 Film under the tongue 2 (two) times daily.   multivitamin with minerals Tabs tablet Take 1 tablet by mouth daily. Start taking on: July 26, 2023   naloxone 4 MG/0.1ML Liqd nasal spray kit Commonly known as: NARCAN Place 1 spray into the nose once for 1 dose.        Major procedures and Radiology Reports - PLEASE review detailed and final reports for all details, in brief -   ***  ECHOCARDIOGRAM COMPLETE  Result Date: 07/23/2023    ECHOCARDIOGRAM REPORT   Patient Name:   Donald Marshall Date of Exam:  07/23/2023 Medical Rec #:  [id number]        Height:       69.0 in Accession #:    [id number]       Weight:       145.3 lb Date of Birth:  [birth year]       BSA:          1.804 m Patient Age:    26 years         BP:  109/80 mmHg Patient Gender: M                HR:           93 bpm. Exam Location:  Inpatient Procedure: 2D Echo, Color Doppler and Cardiac Doppler Indications:    elevated troponin  History:        Patient has no prior history of Echocardiogram examinations.                 Risk Factors:polysubstance abuse and Current Smoker.  Sonographer:    Delcie Roch RDCS Referring Phys: [id number] Cristopher Peru  Sonographer Comments: Echo performed with patient supine and on artificial respirator. Image acquisition challenging due to respiratory motion. IMPRESSIONS  1. Left ventricular ejection fraction, by estimation, is 40 to 45%. The left ventricle has mildly decreased function. The left ventricle demonstrates global hypokinesis. Left ventricular diastolic parameters were normal.  2. Right ventricular systolic function is normal. The right ventricular size is normal.  3. The mitral valve is normal in structure. No evidence of mitral valve regurgitation. No evidence of mitral stenosis.  4. The aortic valve is normal in structure. Aortic valve regurgitation is not visualized. No aortic stenosis is present.  5. The inferior vena cava is normal in size with greater than 50% respiratory variability, suggesting right atrial pressure of 3 mmHg. FINDINGS  Left Ventricle: Left ventricular ejection fraction, by estimation, is 40 to 45%. The left ventricle has mildly decreased function. The left ventricle demonstrates global hypokinesis. The left ventricular internal cavity size was normal in size. There is  no left ventricular hypertrophy. Left ventricular diastolic parameters were normal. Normal left ventricular filling pressure.  LV Wall Scoring: The entire septum, entire inferior wall, and posterior wall are  hypokinetic. The entire anterior wall, antero-lateral wall, apical lateral segment, and apex are normal. Right Ventricle: The right ventricular size is normal. No increase in right ventricular wall thickness. Right ventricular systolic function is normal. Left Atrium: Left atrial size was normal in size. Right Atrium: Right atrial size was normal in size. Pericardium: There is no evidence of pericardial effusion. Mitral Valve: The mitral valve is normal in structure. No evidence of mitral valve regurgitation. No evidence of mitral valve stenosis. Tricuspid Valve: The tricuspid valve is normal in structure. Tricuspid valve regurgitation is not demonstrated. No evidence of tricuspid stenosis. Aortic Valve: The aortic valve is normal in structure. Aortic valve regurgitation is not visualized. No aortic stenosis is present. Pulmonic Valve: The pulmonic valve was normal in structure. Pulmonic valve regurgitation is not visualized. No evidence of pulmonic stenosis. Aorta: The aortic root is normal in size and structure. Venous: The inferior vena cava is normal in size with greater than 50% respiratory variability, suggesting right atrial pressure of 3 mmHg. IAS/Shunts: No atrial level shunt detected by color flow Doppler.  LEFT VENTRICLE PLAX 2D LVIDd:         4.50 cm     Diastology LVIDs:         3.60 cm     LV e' medial:    12.90 cm/s LV PW:         0.90 cm     LV E/e' medial:  6.3 LV IVS:        0.70 cm     LV e' lateral:   20.30 cm/s LVOT diam:     1.90 cm     LV E/e' lateral: 4.0 LV SV:         45  LV SV Index:   25 LVOT Area:     2.84 cm  LV Volumes (MOD) LV vol d, MOD A4C: 88.7 ml LV vol s, MOD A4C: 48.9 ml LV SV MOD A4C:     88.7 ml RIGHT VENTRICLE             IVC RV Basal diam:  2.10 cm     IVC diam: 1.70 cm RV S prime:     11.70 cm/s TAPSE (M-mode): 1.7 cm LEFT ATRIUM             Index        RIGHT ATRIUM          Index LA diam:        2.60 cm 1.44 cm/m   RA Area:     8.14 cm LA Vol (A2C):   30.7 ml 17.02  ml/m  RA Volume:   15.30 ml 8.48 ml/m LA Vol (A4C):   26.4 ml 14.64 ml/m LA Biplane Vol: 30.1 ml 16.69 ml/m  AORTIC VALVE LVOT Vmax:   103.00 cm/s LVOT Vmean:  69.300 cm/s LVOT VTI:    0.160 m  AORTA Ao Root diam: 2.60 cm MITRAL VALVE MV Area (PHT): 2.87 cm    SHUNTS MV Decel Time: 264 msec    Systemic VTI:  0.16 m MV E velocity: 81.70 cm/s  Systemic Diam: 1.90 cm MV A velocity: 54.40 cm/s MV E/A ratio:  1.50 Chilton Si MD Electronically signed by Chilton Si MD Signature Date/Time: 07/23/2023/4:11:20 PM    Final    EEG adult  Result Date: 07/23/2023 Donald Quest, MD     07/23/2023  9:10 AM Patient Name: Donald Marshall MRN: [id number] Epilepsy Attending: Charlsie Marshall Referring Physician/Provider: Chilton Greathouse, MD Date: 07/23/2023 Duration: 25.46 mins Patient history: 27yo M with ams getting eeg to evaluate for seizure Level of alertness:  comatose AEDs during EEG study: Propofol Technical aspects: This EEG study was done with scalp electrodes positioned according to the 10-20 International system of electrode placement. Electrical activity was reviewed with band pass filter of 1-70Hz , sensitivity of 7 uV/mm, display speed of 71mm/sec with a 60Hz  notched filter applied as appropriate. EEG data were recorded continuously and digitally stored.  Video monitoring was available and reviewed as appropriate. Description: EEG showed continuous generalized predominantly 5-8Hz  theta -alpha activity admixed with intermittent 2-3Hz  delta slowing. Physiologic photic driving was not seen during photic stimulation.  Hyperventilation was not performed.   ABNORMALITY - Continuous slow, generalized IMPRESSION: This study is suggestive of moderate diffuse encephalopathy. No seizures or epileptiform discharges were seen throughout the recording. Donald Marshall   DG CHEST PORT 1 VIEW  Result Date: 07/23/2023 CLINICAL DATA:  [id number] with ventilator dependent respiratory failure. [id number].  Encounter for  orogastric tube placement. EXAM: PORTABLE CHEST 1 VIEW COMPARISON:  Portable chest today at 6:46 p.m. FINDINGS: 11:15 p.m. ETT tip is 4.3 cm from the carina. The cardiomediastinal silhouette is normal. NGT has been inserted and loops around to the left within the stomach with the tip in the proximal lumen. Interval increased haziness left lower perihilar area which could indicate evidence of left lower lobe atelectasis, pneumonia or aspiration. Otherwise could be due to a small pleural effusion layering posteriorly. The lungs are otherwise clear.  Thoracic cage is intact. IMPRESSION: 1. ETT tip 4.3 cm from the carina. 2. NGT loops around to the left in the stomach with tip in the proximal lumen. 3. Increased haziness left lower perihilar  area which could indicate evidence of left lower lobe atelectasis, pneumonia or aspiration, or small layering pleural effusion. Electronically Signed   By: Donald Marshall M.D.   On: 07/23/2023 00:37   CT Head Wo Contrast  Result Date: 07/22/2023 CLINICAL DATA:  Mental status change, unknown cause; Neck trauma, intoxicated or obtunded (Age >= 16y) EXAM: CT HEAD WITHOUT CONTRAST CT CERVICAL SPINE WITHOUT CONTRAST TECHNIQUE: Multidetector CT imaging of the head and cervical spine was performed following the standard protocol without intravenous contrast. Multiplanar CT image reconstructions of the cervical spine were also generated. RADIATION DOSE REDUCTION: This exam was performed according to the departmental dose-optimization program which includes automated exposure control, adjustment of the mA and/or kV according to patient size and/or use of iterative reconstruction technique. COMPARISON:  None Available. FINDINGS: CT HEAD FINDINGS Brain: No evidence of large-territorial acute infarction. No parenchymal hemorrhage. No mass lesion. No extra-axial collection. No mass effect or midline shift. No hydrocephalus. Basilar cisterns are patent. Vascular: No hyperdense vessel. Skull:  No acute fracture or focal lesion. Sinuses/Orbits: Paranasal sinuses and mastoid air cells are clear. The orbits are unremarkable. Other: None. CT CERVICAL SPINE FINDINGS Alignment: Normal. Skull base and vertebrae: No acute fracture. No aggressive appearing focal osseous lesion or focal pathologic process. Soft tissues and spinal canal: No prevertebral fluid or swelling. No visible canal hematoma. Upper chest: Left upper lobe patchy ground-glass airspace opacities. Other: Secretions within the posterior nasal cavities, nasopharynx, oropharynx. IMPRESSION: 1. No acute intracranial abnormality. 2. No acute displaced fracture or traumatic listhesis of the cervical spine. 3. Left upper lobe patchy ground-glass airspace opacities. Findings suggestive developing infection/inflammation. Electronically Signed   By: Donald Marshall M.D.   On: 07/22/2023 19:47   CT Cervical Spine Wo Contrast  Result Date: 07/22/2023 CLINICAL DATA:  Mental status change, unknown cause; Neck trauma, intoxicated or obtunded (Age >= 16y) EXAM: CT HEAD WITHOUT CONTRAST CT CERVICAL SPINE WITHOUT CONTRAST TECHNIQUE: Multidetector CT imaging of the head and cervical spine was performed following the standard protocol without intravenous contrast. Multiplanar CT image reconstructions of the cervical spine were also generated. RADIATION DOSE REDUCTION: This exam was performed according to the departmental dose-optimization program which includes automated exposure control, adjustment of the mA and/or kV according to patient size and/or use of iterative reconstruction technique. COMPARISON:  None Available. FINDINGS: CT HEAD FINDINGS Brain: No evidence of large-territorial acute infarction. No parenchymal hemorrhage. No mass lesion. No extra-axial collection. No mass effect or midline shift. No hydrocephalus. Basilar cisterns are patent. Vascular: No hyperdense vessel. Skull: No acute fracture or focal lesion. Sinuses/Orbits: Paranasal sinuses and  mastoid air cells are clear. The orbits are unremarkable. Other: None. CT CERVICAL SPINE FINDINGS Alignment: Normal. Skull base and vertebrae: No acute fracture. No aggressive appearing focal osseous lesion or focal pathologic process. Soft tissues and spinal canal: No prevertebral fluid or swelling. No visible canal hematoma. Upper chest: Left upper lobe patchy ground-glass airspace opacities. Other: Secretions within the posterior nasal cavities, nasopharynx, oropharynx. IMPRESSION: 1. No acute intracranial abnormality. 2. No acute displaced fracture or traumatic listhesis of the cervical spine. 3. Left upper lobe patchy ground-glass airspace opacities. Findings suggestive developing infection/inflammation. Electronically Signed   By: Donald Marshall M.D.   On: 07/22/2023 19:47   DG Chest Portable 1 View  Result Date: 07/22/2023 CLINICAL DATA:  Intubation Uses meth regularly used heroin last night, girlfriend went and brought narcan from [employer] and gave 2 doses. When ems arrived pt was moaning/rigged, pupils was a 4.  EXAM: PORTABLE CHEST 1 VIEW COMPARISON:  Chest x-ray 07/22/2023 6:12 p.m. FINDINGS: Interval placement endotracheal tube with tip terminating 3 cm above the carina. The heart and mediastinal contours are within normal limits. No focal consolidation. No pulmonary edema. No pleural effusion. No pneumothorax. No acute osseous abnormality. IMPRESSION: 1. Endotracheal tube in good position. 2. No active disease. Electronically Signed   By: Donald Marshall M.D.   On: 07/22/2023 19:43   DG Chest Portable 1 View  Result Date: 07/22/2023 CLINICAL DATA:  hypoxia EXAM: PORTABLE CHEST 1 VIEW COMPARISON:  Chest x-ray 11/16/2013 FINDINGS: The heart and mediastinal contours are within normal limits. No focal consolidation. No pulmonary edema. No pleural effusion. No pneumothorax. No acute osseous abnormality. IMPRESSION: No active disease. Electronically Signed   By: Donald Marshall M.D.   On: 07/22/2023  19:42    Micro Results   *** Recent Results (from the past 240 hour(s))  Culture, blood (routine x 2)     Status: None (Preliminary result)   Collection Time: 07/22/23  7:26 PM   Specimen: Left Antecubital; Blood  Result Value Ref Range Status   Specimen Description   Final    LEFT ANTECUBITAL BOTTLES DRAWN AEROBIC AND ANAEROBIC   Special Requests   Final    BOTTLES DRAWN AEROBIC ONLY Blood Culture adequate volume   Culture   Final    NO GROWTH 3 DAYS Performed at [facility], [address] [place], [place] [address]    Report Status PENDING  Incomplete  SARS Coronavirus 2 by RT PCR (hospital order, performed in [facility] hospital lab) *cepheid single result test* Urine, Catheterized     Status: None   Collection Time: 07/22/23  7:35 PM   Specimen: Urine, Catheterized; Nasal Swab  Result Value Ref Range Status   SARS Coronavirus 2 by RT PCR NEGATIVE NEGATIVE Final    Comment: (NOTE) SARS-CoV-2 target nucleic acids are NOT DETECTED.  The SARS-CoV-2 RNA is generally detectable in upper and lower respiratory specimens during the acute phase of infection. The lowest concentration of SARS-CoV-2 viral copies this assay can detect is 250 copies / mL. A negative result does not preclude SARS-CoV-2 infection and should not be used as the sole basis for treatment or other patient management decisions.  A negative result may occur with improper specimen collection / handling, submission of specimen other than nasopharyngeal swab, presence of viral mutation(s) within the areas targeted by this assay, and inadequate number of viral copies (<250 copies / mL). A negative result must be combined with clinical observations, patient history, and epidemiological information.  Fact Sheet for Patients:   RoadLapTop.co.za  Fact Sheet for Healthcare Providers: http://kim-miller.com/  This test is not yet approved or  cleared by the [place]  FDA and has been authorized for detection and/or diagnosis of SARS-CoV-2 by FDA under an Emergency Use Authorization (EUA).  This EUA will remain in effect (meaning this test can be used) for the duration of the COVID-19 declaration under Section 564(b)(1) of the Act, 21 U.S.C. section 360bbb-3(b)(1), unless the authorization is terminated or revoked sooner.  Performed at [facility], [address] [place], [place] [address]   Urine Culture     Status: None   Collection Time: 07/22/23  7:35 PM   Specimen: Urine, Random  Result Value Ref Range Status   Specimen Description   Final    URINE, RANDOM Performed at [facility], [address] [place], [place] [address]    Special Requests  Final    NONE Reflexed from [phone] Performed at [facility], [address] [place], [place] [address]    Culture   Final    NO GROWTH Performed at [facility], [address] [place], [place] [address]    Report Status 07/24/2023 FINAL  Final  Culture, blood (routine x 2)     Status: None (Preliminary result)   Collection Time: 07/22/23  7:55 PM   Specimen: BLOOD  Result Value Ref Range Status   Specimen Description BLOOD BLOOD LEFT HAND  Final   Special Requests   Final    BOTTLES DRAWN AEROBIC ONLY Blood Culture adequate volume   Culture   Final    NO GROWTH 3 DAYS Performed at [facility], [address], [place], [place] [address]    Report Status PENDING  Incomplete  MRSA Next Gen by PCR, Nasal     Status: None   Collection Time: 07/22/23 10:20 PM   Specimen: Nasal Mucosa; Nasal Swab  Result Value Ref Range Status   MRSA by PCR Next Gen NOT DETECTED NOT DETECTED Final    Comment: (NOTE) The GeneXpert MRSA Assay (FDA approved for NASAL specimens only), is one component of a comprehensive MRSA colonization surveillance program. It is not intended to diagnose MRSA infection nor to guide or monitor treatment for MRSA infections. Test performance is not FDA approved in  patients less than 85 years old. Performed at [facility], [address] [place], [place] [address]   Culture, Respiratory w Gram Stain     Status: None   Collection Time: 07/22/23 11:40 PM   Specimen: Tracheal Aspirate; Respiratory  Result Value Ref Range Status   Specimen Description TRACHEAL ASPIRATE  Final   Special Requests NONE  Final   Gram Stain   Final    MODERATE WBC PRESENT,BOTH PMN AND MONONUCLEAR ABUNDANT GRAM POSITIVE COCCI Performed at [facility], [address], [place], [place] [address]    Culture FEW STREPTOCOCCUS PNEUMONIAE  Final   Report Status 07/25/2023 FINAL  Final   Organism ID, Bacteria STREPTOCOCCUS PNEUMONIAE  Final      Susceptibility   Streptococcus pneumoniae - MIC*    ERYTHROMYCIN <=0.12 SENSITIVE Sensitive     LEVOFLOXACIN 1 SENSITIVE Sensitive     VANCOMYCIN 0.5 SENSITIVE Sensitive     PENICILLIN (meningitis) <=0.06 SENSITIVE Sensitive     PENO - penicillin <=0.06      PENICILLIN (non-meningitis) <=0.06 SENSITIVE Sensitive     PENICILLIN (oral) <=0.06 SENSITIVE Sensitive     CEFTRIAXONE (non-meningitis) <=0.12 SENSITIVE Sensitive     CEFTRIAXONE (meningitis) <=0.12 SENSITIVE Sensitive     * FEW STREPTOCOCCUS PNEUMONIAE    Today   Subjective    Arland Mcconaha today has no ***         Younger sister Morrie Sheldon at bedside, questions answered  Patient has been seen and examined prior to discharge   Objective   Blood pressure 117/78, pulse 63, temperature 98.6 F (37 C), temperature source Oral, resp. rate (!) 21, height 5' 9.02" (1.753 m), weight 68 kg, SpO2 99%.   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  Lungs-  CTAB , good air movement bilaterally CV- S1, S2  normal, regular Abd-   +ve B.Sounds, Abd Soft, No tenderness,    Extremity/Skin:- No  edema,   good pulses Psych-affect is appropriate, oriented x3 Neuro-no new focal deficits, no tremors ***   Data Review   CBC w Diff:  Lab Results  Component Value Date   WBC 5.8 07/25/2023   HGB 13.5 07/25/2023   HCT 42.5 07/25/2023   PLT 162 07/25/2023   LYMPHOPCT 7 07/22/2023   BANDSPCT 4 07/22/2023   MONOPCT 10 07/22/2023   EOSPCT 0 07/22/2023   BASOPCT 0 07/22/2023    CMP:  Lab Results  Component Value Date   NA 139 07/25/2023   K 4.1 07/25/2023   CL 103 07/25/2023   CO2 28 07/25/2023   BUN 13 07/25/2023   CREATININE 0.94 07/25/2023   PROT 5.5 (L) 07/23/2023   ALBUMIN 3.1 (L) 07/23/2023   BILITOT 0.7 07/23/2023   ALKPHOS 44 07/23/2023   AST 94 (H) 07/23/2023   ALT [facility] (H) 07/23/2023  .  Total Discharge time is about 33 minutes  Shon Hale M.D on 07/25/2023 at 1:22 PM  Go to [email or web address] -  for contact info  [facility] - Office  [phone]

## 2023-07-25 NOTE — TOC Transition Note (Addendum)
Transition of Care [facility]) - CM/SW Discharge Note   Patient Details  Name: Donald Marshall MRN: [id number] Date of Birth: [birth year]  Transition of Care [facility]) CM/SW Contact:  Tom-Johnson, Hershal Coria, RN Phone Number: 07/25/2023, 1:56 PM   Clinical Narrative:     Patient is scheduled for discharge today.  Readmission Risk Assessment done. New patient establishment and hospital f/u scheduled with Internal Medicine per patient's request, info on AVS.  Discharge instructions on AVS. Prescriptions sent to [facility] pharmacy and meds will be delivered to patient at bedside prior discharge. No PT f/u noted. Sister, Ashlyn to transport at discharge.  No further TOC needs noted.        Final next level of care: Home/Self Care Barriers to Discharge: Barriers Resolved   Patient Goals and CMS Choice CMS Medicare.gov Compare Post Acute Care list provided to:: Patient Choice offered to / list presented to : Patient  Discharge Placement                  Patient to be transferred to facility by: Sister Name of family member notified: Ashlyn    Discharge Plan and Services Additional resources added to the After Visit Summary for                  DME Arranged: N/A DME Agency: NA       HH Arranged: NA HH Agency: NA        Social Determinants of Health (SDOH) Interventions SDOH Screenings   Food Insecurity: No Food Insecurity (07/25/2023)  Housing: Low Risk  (07/25/2023)  Transportation Needs: No Transportation Needs (07/25/2023)  Utilities: Not At Risk (07/25/2023)  Tobacco Use: High Risk (07/22/2023)     Readmission Risk Interventions    07/25/2023    1:54 PM  Readmission Risk Prevention Plan  Post Dischage Appt Complete  Medication Screening Complete  Transportation Screening Complete

## 2023-07-27 LAB — CULTURE, BLOOD (ROUTINE X 2)
Culture: NO GROWTH
Culture: NO GROWTH
Special Requests: ADEQUATE
Special Requests: ADEQUATE

## 2023-08-20 ENCOUNTER — Ambulatory Visit: Payer: 59 | Admitting: Internal Medicine

## 2024-03-24 ENCOUNTER — Other Ambulatory Visit: Payer: Self-pay

## 2024-03-24 ENCOUNTER — Encounter (HOSPITAL_COMMUNITY): Payer: Self-pay

## 2024-03-24 ENCOUNTER — Emergency Department (HOSPITAL_COMMUNITY)
Admission: EM | Admit: 2024-03-24 | Discharge: 2024-03-24 | Disposition: A | Discharge status: Home / Self Care | Payer: MEDICAID | Attending: Emergency Medicine | Admitting: Emergency Medicine

## 2024-03-24 DIAGNOSIS — L02414 Cutaneous abscess of left upper limb: Secondary | ICD-10-CM | POA: Diagnosis not present

## 2024-03-24 DIAGNOSIS — L02419 Cutaneous abscess of limb, unspecified: Secondary | ICD-10-CM

## 2024-03-24 MED ORDER — DOXYCYCLINE HYCLATE 100 MG PO TABS
100.0000 mg | ORAL_TABLET | Freq: Once | ORAL | Status: AC
  Administered 2024-03-24: 100 mg via ORAL
  Filled 2024-03-24: qty 1

## 2024-03-24 MED ORDER — LIDOCAINE HCL (PF) 1 % IJ SOLN
5.0000 mL | Freq: Once | INTRAMUSCULAR | Status: AC
  Administered 2024-03-24: 5 mL

## 2024-03-24 MED ORDER — DOXYCYCLINE HYCLATE 100 MG PO CAPS: 100.0000 mg | ORAL_CAPSULE | Freq: Two times a day (BID) | ORAL | 0 refills | Status: AC

## 2024-03-24 MED ORDER — LIDOCAINE HCL (PF) 1 % IJ SOLN
INTRAMUSCULAR | Status: AC
  Filled 2024-03-24: qty 5

## 2024-03-24 NOTE — ED Notes (Signed)
 Pt's left arm wrapped by this nurse.

## 2024-03-24 NOTE — Discharge Instructions (Signed)
 Begin taking doxycycline  as prescribed.  Apply warm compresses as frequently as possible for the next several days.  Return to the emergency department for worsening redness, worsening swelling, worsening pain, high fevers, or for other new and concerning symptoms.

## 2024-03-24 NOTE — ED Notes (Signed)
 Pt/family received d/c paperwork at this time. After going over the paperwork any questions, comments, or concerns were answered to the best of this nurse's knowledge. The pt/family verbally acknowledged the teachings/instructions.   D/c info was reviewed by Lonita Roach
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
## 2024-04-21 ENCOUNTER — Emergency Department (HOSPITAL_COMMUNITY)
Admission: EM | Admit: 2024-04-21 | Discharge: 2024-04-21 | Disposition: A | Discharge status: Home / Self Care | Payer: MEDICAID | Attending: Emergency Medicine | Admitting: Emergency Medicine

## 2024-04-21 ENCOUNTER — Other Ambulatory Visit: Payer: Self-pay

## 2024-04-21 ENCOUNTER — Encounter (HOSPITAL_COMMUNITY): Payer: Self-pay

## 2024-04-21 DIAGNOSIS — M7989 Other specified soft tissue disorders: Secondary | ICD-10-CM | POA: Diagnosis present

## 2024-04-21 DIAGNOSIS — L03114 Cellulitis of left upper limb: Secondary | ICD-10-CM | POA: Insufficient documentation

## 2024-04-21 LAB — CBC WITH DIFFERENTIAL/PLATELET
Abs Immature Granulocytes: 0.02 K/uL (ref 0.00–0.07)
Basophils Absolute: 0 K/uL (ref 0.0–0.1)
Basophils Relative: 0 %
Eosinophils Absolute: 0.1 K/uL (ref 0.0–0.5)
Eosinophils Relative: 1 %
HCT: 50.7 % (ref 39.0–52.0)
Hemoglobin: 16.7 g/dL (ref 13.0–17.0)
Immature Granulocytes: 0 %
Lymphocytes Relative: 18 %
Lymphs Abs: 1.7 K/uL (ref 0.7–4.0)
MCH: 28 pg (ref 26.0–34.0)
MCHC: 32.9 g/dL (ref 30.0–36.0)
MCV: 84.9 fL (ref 80.0–100.0)
Monocytes Absolute: 1.1 K/uL — ABNORMAL HIGH (ref 0.1–1.0)
Monocytes Relative: 11 %
Neutro Abs: 6.7 K/uL (ref 1.7–7.7)
Neutrophils Relative %: 70 %
Platelets: 178 K/uL (ref 150–400)
RBC: 5.97 MIL/uL — ABNORMAL HIGH (ref 4.22–5.81)
RDW: 13.2 % (ref 11.5–15.5)
WBC: 9.6 K/uL (ref 4.0–10.5)
nRBC: 0 % (ref 0.0–0.2)

## 2024-04-21 LAB — BASIC METABOLIC PANEL WITH GFR
Anion gap: 14 (ref 5–15)
BUN: 11 mg/dL (ref 6–20)
CO2: 23 mmol/L (ref 22–32)
Calcium: 9.5 mg/dL (ref 8.9–10.3)
Chloride: 100 mmol/L (ref 98–111)
Creatinine, Ser: 0.99 mg/dL (ref 0.61–1.24)
GFR, Estimated: >60 mL/min (ref >60)
Glucose, Bld: 86 mg/dL (ref 70–99)
Potassium: 3.9 mmol/L (ref 3.5–5.1)
Sodium: 137 mmol/L (ref 135–145)

## 2024-04-21 MED ORDER — CLINDAMYCIN HCL 300 MG PO CAPS: 300.0000 mg | ORAL_CAPSULE | Freq: Three times a day (TID) | ORAL | 0 refills | Status: AC

## 2024-04-21 MED ORDER — ONDANSETRON HCL 4 MG/2ML IJ SOLN: 4.0000 mg | Freq: Once | INTRAMUSCULAR | Status: DC

## 2024-04-21 MED ORDER — CLINDAMYCIN HCL 150 MG PO CAPS
300.0000 mg | ORAL_CAPSULE | Freq: Once | ORAL | Status: AC
  Administered 2024-04-21: 300 mg via ORAL
  Filled 2024-04-21: qty 2

## 2024-04-21 MED ORDER — CLINDAMYCIN PHOSPHATE 600 MG/50ML IV SOLN: 600.0000 mg | Freq: Once | INTRAVENOUS | Status: DC

## 2024-04-21 MED ORDER — ONDANSETRON 4 MG PO TBDP: 4.0000 mg | ORAL_TABLET | Freq: Three times a day (TID) | ORAL | 0 refills | Status: AC | PRN

## 2024-04-21 MED ORDER — SODIUM CHLORIDE 0.9 % IV BOLUS: 1000.0000 mL | Freq: Once | INTRAVENOUS | Status: DC

## 2024-04-21 NOTE — ED Triage Notes (Signed)
 Pt arrived via POV c/o abscess X 2 days to posterior left forearm. Pt presents with redness and swelling and skin is warm to touch. Pt denies recent insect bites and reports swelling has worsened over past couple of days.

## 2024-04-21 NOTE — Discharge Instructions (Signed)
 It does not look like it is an abscess yet.  Try and keep yourself hydrated.  Take the antibiotics and the nausea medicine as needed.  If not improving in 2 days return for recheck.  Otherwise follow-up with the primary care doctor as needed if significantly improved.

## 2024-04-21 NOTE — ED Notes (Signed)
 Pt reports he only took 2 capsules of recently prescribed Doxycycline  for a similar complaint due to experiencing emesis after taking the medications.

## 2024-04-21 NOTE — ED Provider Notes (Signed)
 [place] EMERGENCY DEPARTMENT AT [facility] Provider Note   CSN: [id number] Arrival date & time: 04/21/24  1839     Patient presents with: Abscess   Donald Marshall is a 28 y.o. male.    Abscess Patient presents with pain and swelling of left forearm.  On dorsum of left forearm has swelling for the last couple days.  Some erythema moving up the arm.  Has had previous abscess but that was a little more proximal on the arm.  Had had some of the doxycycline  but had vomited it at that time and did not finish the course.  Had vomited twice today.  No fevers.  No history of IV drug use.  Does have history of substance use however.     Prior to Admission medications   Medication Sig Start Date End Date Taking? Authorizing Provider  clindamycin  (CLEOCIN ) 300 MG capsule Take 1 capsule (300 mg total) by mouth 3 (three) times daily. 04/21/24  Yes Patsey Lot, MD  ondansetron  (ZOFRAN -ODT) 4 MG disintegrating tablet Take 1 tablet (4 mg total) by mouth every 8 (eight) hours as needed for nausea or vomiting. 04/21/24  Yes Patsey Lot, MD  Buprenorphine  HCl-Naloxone  HCl 8-2 MG FILM Place 1 Film under the tongue 2 (two) times daily. 06/28/23   [provider]  doxycycline  (VIBRAMYCIN ) 100 MG capsule Take 1 capsule (100 mg total) by mouth 2 (two) times daily. One po bid x 7 days 03/24/24   Geroldine Berg, MD  Multiple Vitamin (MULTIVITAMIN WITH MINERALS) TABS tablet Take 1 tablet by mouth daily. 07/26/23   Pearlean Manus, MD    Allergies: Hydrocodone , Sulfa  antibiotics, and Tramadol     Review of Systems  Updated Vital Signs BP 125/70   Pulse (!) 111   Temp 99.1 F (37.3 C) (Oral)   Resp 18   Ht 5' 9 (1.753 m)   Wt 68 kg   SpO2 100%   BMI 22.14 kg/m   Physical Exam Vitals and nursing note reviewed.  Musculoskeletal:     Comments:  swelling of the left forearm.  Does have erythema particular on dorsal aspect.  Approximately 10 cm of erythema.  No definite  fluctuance.  Bedside ultrasound performed and did have no large fluid collection amenable to drainage.  Neurological:     Mental Status: He is alert.     (all labs ordered are listed, but only abnormal results are displayed) Labs Reviewed  CBC WITH DIFFERENTIAL/PLATELET - Abnormal; Notable for the following components:      Result Value   RBC 5.97 (*)    Monocytes Absolute 1.1 (*)    All other components within normal limits  BASIC METABOLIC PANEL WITH GFR    EKG: None  Radiology: No results found.   Procedures   Medications Ordered in the ED  clindamycin  (CLEOCIN ) capsule 300 mg (has no administration in time range)                                    Medical Decision Making Amount and/or Complexity of Data Reviewed Labs: ordered.  Risk Prescription drug management.   Patient with swelling and redness of forearm.  Likely cellulitis versus abscess.  No clear abscess seen on ultrasound. However has had some vomiting at home.  With this we will treat with clindamycin  IV give some Zofran  and some fluids.  Initial tachycardia.  Does not appear to have drainable  fluid collection at this time.  Has had previous doxycycline  to vomiting so we will give clindamycin .  Antibiotics and fluid had not been given IV yet.  Patient states he needs to leave.  Will give oral dose and discharge.      Final diagnoses:  Cellulitis of forearm, left    ED Discharge Orders          Ordered    clindamycin  (CLEOCIN ) 300 MG capsule  3 times daily        04/21/24 2049    ondansetron  (ZOFRAN -ODT) 4 MG disintegrating tablet  Every 8 hours PRN        04/21/24 2049               Patsey Lot, MD 04/21/24 2051

## 2024-04-23 ENCOUNTER — Encounter (HOSPITAL_COMMUNITY): Payer: Self-pay

## 2024-04-23 ENCOUNTER — Emergency Department (HOSPITAL_COMMUNITY)
Admission: EM | Admit: 2024-04-23 | Discharge: 2024-04-23 | Disposition: A | Discharge status: Home / Self Care | Payer: MEDICAID | Attending: Student | Admitting: Student

## 2024-04-23 ENCOUNTER — Other Ambulatory Visit: Payer: Self-pay

## 2024-04-23 DIAGNOSIS — R000 Tachycardia, unspecified: Secondary | ICD-10-CM | POA: Insufficient documentation

## 2024-04-23 DIAGNOSIS — L02414 Cutaneous abscess of left upper limb: Secondary | ICD-10-CM | POA: Insufficient documentation

## 2024-04-23 DIAGNOSIS — M7989 Other specified soft tissue disorders: Secondary | ICD-10-CM | POA: Diagnosis present

## 2024-04-23 DIAGNOSIS — L0291 Cutaneous abscess, unspecified: Secondary | ICD-10-CM

## 2024-04-23 MED ORDER — POVIDONE-IODINE 10 % EX SOLN
CUTANEOUS | Status: DC | PRN
  Filled 2024-04-23: qty 14.8

## 2024-04-23 NOTE — ED Provider Notes (Signed)
 [place] EMERGENCY DEPARTMENT AT [facility] Provider Note   CSN: [id number] Arrival date & time: 04/23/24  1626     Patient presents with: Abscess   Donald Marshall is a 28 y.o. male.    Abscess Associated symptoms: no fever and no headaches         Donald Marshall is a 28 y.o. male who presents to the Emergency Department complaining of worsening pain redness and swelling of his left forearm.  He was seen here 2 days ago for same.  Was given a dose of oral antibiotics here in prescribed antibiotics for home.  He has not gotten them filled yet due to financial issue.  Is having increased swelling to the area.  He denies any fever chills nausea or vomiting.  denies recent IV drug use  Prior to Admission medications   Medication Sig Start Date End Date Taking? Authorizing Provider  Buprenorphine  HCl-Naloxone  HCl 8-2 MG FILM Place 1 Film under the tongue 2 (two) times daily. 06/28/23   [provider]  clindamycin  (CLEOCIN ) 300 MG capsule Take 1 capsule (300 mg total) by mouth 3 (three) times daily. 04/21/24   Patsey Lot, MD  doxycycline  (VIBRAMYCIN ) 100 MG capsule Take 1 capsule (100 mg total) by mouth 2 (two) times daily. One po bid x 7 days 03/24/24   Geroldine Berg, MD  Multiple Vitamin (MULTIVITAMIN WITH MINERALS) TABS tablet Take 1 tablet by mouth daily. 07/26/23   Pearlean Manus, MD  ondansetron  (ZOFRAN -ODT) 4 MG disintegrating tablet Take 1 tablet (4 mg total) by mouth every 8 (eight) hours as needed for nausea or vomiting. 04/21/24   Patsey Lot, MD    Allergies: Hydrocodone , Sulfa  antibiotics, and Tramadol     Review of Systems  Constitutional:  Negative for appetite change, chills and fever.  Skin:        Localized swelling pain left forearm  Neurological:  Negative for weakness, numbness and headaches.    Updated Vital Signs BP 112/72 (BP Location: Right Arm)   Pulse (!) 114   Temp 98.1 F (36.7 C) (Oral)   Resp 20   Ht 5' 9  (1.753 m)   Wt 67.6 kg   SpO2 99%   BMI 22.00 kg/m   Physical Exam Vitals and nursing note reviewed.  Constitutional:      General: He is not in acute distress.    Appearance: Normal appearance.  Cardiovascular:     Rate and Rhythm: Regular rhythm. Tachycardia present.     Pulses: Normal pulses.  Pulmonary:     Effort: Pulmonary effort is normal.     Breath sounds: Normal breath sounds.  Musculoskeletal:        General: Normal range of motion.     Comments: Pt has good ROM of the left wrist and elbow  Lymphadenopathy:     Cervical: No cervical adenopathy.  Skin:    General: Skin is warm.     Capillary Refill: Capillary refill takes less than 2 seconds.     Findings: Erythema present.     Comments: 4 cm localized area of induration and fluctuance to the medial mid to distal left forearm.  No drainage or lymphangitis.   Neurological:     General: No focal deficit present.     Mental Status: He is alert.     Sensory: No sensory deficit.     Motor: No weakness.    See attached photo   (all labs ordered are listed, but only abnormal  results are displayed) Labs Reviewed - No data to display  EKG: None  Radiology: No results found.   Procedures   Medications Ordered in the ED - No data to display                                  Medical Decision Making Patient seen here few days ago for area of redness to the left forearm.  No obvious abscess at that time.  He was given antibiotics during his ER stay and prescription was written.  He returns this evening with increased swelling and discomfort of the area.  He has not gotten antibiotics filled, but states that his sister is going to pay for his antibiotics tomorrow.  He denies any fever or chills.  Patient adamantly denies IV drug use  Clinically, this appears to be abscess.  He is tachycardic on arrival but afebrile and without systemic symptoms.  Does not appear to be any joint involvement.  He is agreeable to  I&D.  Amount and/or Complexity of Data Reviewed Discussion of management or test interpretation with external provider(s):   I was informed by nursing staff that patient left the department prior to his planned I&D  Due to high volume in the ER patient did have extended wait time for procedure.           Final diagnoses:  Abscess    ED Discharge Orders     None          Herlinda Milling, DEVONNA 04/25/24 1812    Kommor, Lum, MD 04/29/24 [phone]

## 2024-04-23 NOTE — ED Notes (Signed)
 Pt left stating 'I can't just keep waiting here anymore' then walked out of fast track hall.

## 2024-04-23 NOTE — ED Triage Notes (Signed)
 Large indurated abscess to left forearm.  Denies IV drug use in that area.  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
